# Patient Record
Sex: Female | Born: 1985 | Race: White | Hispanic: No | Marital: Married | Smoking: Never smoker
Health system: Southern US, Community
[De-identification: ages and names within clinical notes are randomized; demographics above are authoritative.]

## PROBLEM LIST (undated history)

## (undated) DIAGNOSIS — K5792 Diverticulitis of intestine, part unspecified, without perforation or abscess without bleeding: Secondary | ICD-10-CM

## (undated) DIAGNOSIS — G43909 Migraine, unspecified, not intractable, without status migrainosus: Secondary | ICD-10-CM

## (undated) DIAGNOSIS — N83209 Unspecified ovarian cyst, unspecified side: Secondary | ICD-10-CM

## (undated) DIAGNOSIS — K589 Irritable bowel syndrome without diarrhea: Secondary | ICD-10-CM

## (undated) HISTORY — PX: OTHER SURGICAL HISTORY: SHX169

## (undated) HISTORY — PX: TONSILLECTOMY: SUR1361

## (undated) HISTORY — PX: CHOLECYSTECTOMY: SHX55

## (undated) HISTORY — PX: EXCISIONAL HEMORRHOIDECTOMY: SHX1541

---
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
## 2008-05-15 ENCOUNTER — Emergency Department: Payer: Self-pay | Admitting: Internal Medicine

## 2008-07-28 ENCOUNTER — Emergency Department: Payer: Self-pay | Admitting: Emergency Medicine

## 2008-09-17 ENCOUNTER — Emergency Department: Payer: Self-pay | Admitting: Emergency Medicine

## 2009-07-24 ENCOUNTER — Inpatient Hospital Stay (HOSPITAL_COMMUNITY): Admission: AD | Admit: 2009-07-24 | Discharge: 2009-07-29 | Payer: Self-pay | Admitting: Obstetrics and Gynecology

## 2009-07-24 ENCOUNTER — Ambulatory Visit: Payer: Self-pay | Admitting: Family Medicine

## 2010-08-20 ENCOUNTER — Other Ambulatory Visit: Payer: Self-pay | Admitting: Adult Health

## 2010-08-20 ENCOUNTER — Other Ambulatory Visit (HOSPITAL_COMMUNITY)
Admission: RE | Admit: 2010-08-20 | Discharge: 2010-08-20 | Disposition: A | Discharge status: Home / Self Care | Payer: PRIVATE HEALTH INSURANCE | Source: Ambulatory Visit | Attending: Obstetrics and Gynecology | Admitting: Obstetrics and Gynecology

## 2010-08-20 DIAGNOSIS — Z01419 Encounter for gynecological examination (general) (routine) without abnormal findings: Secondary | ICD-10-CM | POA: Insufficient documentation

## 2010-08-22 LAB — CBC
HCT: 31.8 % — ABNORMAL LOW (ref 36.0–46.0)
HCT: 31.9 % — ABNORMAL LOW (ref 36.0–46.0)
HCT: 34.3 % — ABNORMAL LOW (ref 36.0–46.0)
Hemoglobin: 10.4 g/dL — ABNORMAL LOW (ref 12.0–15.0)
Hemoglobin: 10.4 g/dL — ABNORMAL LOW (ref 12.0–15.0)
Hemoglobin: 11.2 g/dL — ABNORMAL LOW (ref 12.0–15.0)
MCHC: 32.6 g/dL (ref 30.0–36.0)
MCHC: 32.7 g/dL (ref 30.0–36.0)
MCHC: 32.8 g/dL (ref 30.0–36.0)
MCV: 84.2 fL (ref 78.0–100.0)
MCV: 84.3 fL (ref 78.0–100.0)
MCV: 84.8 fL (ref 78.0–100.0)
Platelets: 274 10*3/uL (ref 150–400)
Platelets: 290 10*3/uL (ref 150–400)
Platelets: 294 10*3/uL (ref 150–400)
RBC: 3.77 MIL/uL — ABNORMAL LOW (ref 3.87–5.11)
RBC: 3.78 MIL/uL — ABNORMAL LOW (ref 3.87–5.11)
RBC: 4.05 MIL/uL (ref 3.87–5.11)
RDW: 14 % (ref 11.5–15.5)
RDW: 14.3 % (ref 11.5–15.5)
RDW: 14.4 % (ref 11.5–15.5)
WBC: 10 10*3/uL (ref 4.0–10.5)
WBC: 18.1 10*3/uL — ABNORMAL HIGH (ref 4.0–10.5)
WBC: 8.1 10*3/uL (ref 4.0–10.5)

## 2010-08-22 LAB — COMPREHENSIVE METABOLIC PANEL
ALT: 14 U/L (ref 0–35)
ALT: 16 U/L (ref 0–35)
AST: 14 U/L (ref 0–37)
AST: 16 U/L (ref 0–37)
Albumin: 2.4 g/dL — ABNORMAL LOW (ref 3.5–5.2)
Albumin: 2.4 g/dL — ABNORMAL LOW (ref 3.5–5.2)
Alkaline Phosphatase: 150 U/L — ABNORMAL HIGH (ref 39–117)
Alkaline Phosphatase: 162 U/L — ABNORMAL HIGH (ref 39–117)
BUN: 2 mg/dL — ABNORMAL LOW (ref 6–23)
BUN: 4 mg/dL — ABNORMAL LOW (ref 6–23)
CO2: 21 mEq/L (ref 19–32)
CO2: 24 mEq/L (ref 19–32)
Calcium: 8 mg/dL — ABNORMAL LOW (ref 8.4–10.5)
Calcium: 8.7 mg/dL (ref 8.4–10.5)
Chloride: 104 mEq/L (ref 96–112)
Chloride: 108 mEq/L (ref 96–112)
Creatinine, Ser: 0.43 mg/dL (ref 0.4–1.2)
Creatinine, Ser: <0.3 mg/dL — ABNORMAL LOW (ref 0.4–1.2)
GFR calc Af Amer: >60 mL/min (ref >60)
GFR calc non Af Amer: >60 mL/min (ref >60)
Glucose, Bld: 67 mg/dL — ABNORMAL LOW (ref 70–99)
Glucose, Bld: 75 mg/dL (ref 70–99)
Potassium: 3.4 mEq/L — ABNORMAL LOW (ref 3.5–5.1)
Potassium: 3.4 mEq/L — ABNORMAL LOW (ref 3.5–5.1)
Sodium: 137 mEq/L (ref 135–145)
Sodium: 137 mEq/L (ref 135–145)
Total Bilirubin: 0.4 mg/dL (ref 0.3–1.2)
Total Bilirubin: 0.4 mg/dL (ref 0.3–1.2)
Total Protein: 5.3 g/dL — ABNORMAL LOW (ref 6.0–8.3)
Total Protein: 5.5 g/dL — ABNORMAL LOW (ref 6.0–8.3)

## 2010-08-22 LAB — PROTIME-INR
INR: 1.13 (ref 0.00–1.49)
Prothrombin Time: 14.4 seconds (ref 11.6–15.2)

## 2010-08-22 LAB — TYPE AND SCREEN
ABO/RH(D): A POS
Antibody Screen: NEGATIVE

## 2010-08-22 LAB — STREP B DNA PROBE: Strep Group B Ag: NEGATIVE

## 2010-08-22 LAB — LACTATE DEHYDROGENASE: LDH: 125 U/L (ref 94–250)

## 2010-08-22 LAB — URIC ACID: Uric Acid, Serum: 3 mg/dL (ref 2.4–7.0)

## 2010-08-22 LAB — ABO/RH: ABO/RH(D): A POS

## 2010-08-22 LAB — MAGNESIUM: Magnesium: 4 mg/dL — ABNORMAL HIGH (ref 1.5–2.5)

## 2010-08-22 LAB — APTT: aPTT: 30 seconds (ref 24–37)

## 2010-08-31 ENCOUNTER — Emergency Department (HOSPITAL_COMMUNITY)
Admission: EM | Admit: 2010-08-31 | Discharge: 2010-08-31 | Disposition: A | Discharge status: Home / Self Care | Payer: PRIVATE HEALTH INSURANCE | Attending: Emergency Medicine | Admitting: Emergency Medicine

## 2010-08-31 ENCOUNTER — Emergency Department (HOSPITAL_COMMUNITY): Payer: PRIVATE HEALTH INSURANCE

## 2010-08-31 DIAGNOSIS — W108XXA Fall (on) (from) other stairs and steps, initial encounter: Secondary | ICD-10-CM | POA: Insufficient documentation

## 2010-08-31 DIAGNOSIS — S59909A Unspecified injury of unspecified elbow, initial encounter: Secondary | ICD-10-CM | POA: Insufficient documentation

## 2010-08-31 DIAGNOSIS — S6990XA Unspecified injury of unspecified wrist, hand and finger(s), initial encounter: Secondary | ICD-10-CM | POA: Insufficient documentation

## 2010-08-31 DIAGNOSIS — M79609 Pain in unspecified limb: Secondary | ICD-10-CM | POA: Insufficient documentation

## 2010-08-31 DIAGNOSIS — M25529 Pain in unspecified elbow: Secondary | ICD-10-CM | POA: Insufficient documentation

## 2010-10-17 NOTE — Op Note (Signed)
Sarah Collins, BLOMGREN             ACCOUNT NO.:  [id number]   MEDICAL RECORD NO.:  [id number]          PATIENT TYPE:  AMB   LOCATION:  DAY                           FACILITY:  [facility]   PHYSICIAN:  Lionel December, M.D.    DATE OF BIRTH:  01-Jan-1986   DATE OF PROCEDURE:  DATE OF DISCHARGE:  08/21/2005                                 OPERATIVE REPORT   PROCEDURE:  Esophageal pH monitoring using a Bravo device (48 hours study).   INDICATION:  [name] is an 25 year old Caucasian female with symptoms of  chronic GERD who is not responding to therapy.  She had  esophagogastroduodenoscopy two days ago.  She had very mild changes of  reflux esophagitis limited to GE junction.  Bravo device was placed for the  study.   Please note that this study is being performed off any acid suppression.   FINDINGS:  Day one analysis:  Number of reflux episodes 31, 12 of which  occurred in upright position and 19 in supine and 22 of these occurred in  postprandial period. Number of reflux episodes greater than 5 minutes 0.  Duration of longest reflux was 4 minutes. Time pH less than 4 was 26  minutes. Fraction time pH less than 4 is 1.9%.   Day two analysis:  Number of reflux episodes is 24, 14 of which occurred in  upright position, 11 in supine and 15 in postprandial.  Number of reflux  episodes greater than 5 minutes was 0. Longest reflux episode was 2 minutes.  Time pH less than 4 was 17 minutes.  Fraction time pH less than 4 is 1.3%.   Two-day analysis:  Study duration is 44 hours and 8 minutes. Number of  reflux episodes 55.  Number of refluxes greater than 5 minutes is 0.  Duration of longest reflux episodes was 4 minutes. Time pH below 4 was 43  minutes.  Fraction time pH below 4 is 1.6%.   SYMPTOM DIARY:  The patient reported eight episodes of heartburn during this  study.  Two of these cannot be evaluated because of  trnsient malfunction of  recording device.  Other six episodes of heartburn  were not associated with  acid in her esophagus.   She reported 13 episodes of chest pain, and there was no acid documented  with any of these episodes.   ASSESSMENT:  Sarah Collins is having acid reflux which is within physiologic  range.   She reported eight episodes of heartburn, six of which can be evaluated, and  none of these were associated with acid reflux.  Similarly, she had 13  episodes of chest pain, and acid was documented with none of these episodes.  Therefore, there is virtually no symptom correlation.   Results were reviewed with Sharalee's mother over the phone on March, 25,  2007.   RECOMMENDATIONS:  Empiric therapy with cimetidine 300 mg q.i.d.  She will  return for OV in one month.  If she remains symptomatic, may consider  tricyclic antidepressant at a low-dose or refer her back to tertiary center  for further evaluation.  Lionel December, M.D.  Electronically Signed     NR/MEDQ  D:  08/30/2005  T:  08/31/2005  Job:  [id number]   cc:   Donzetta Sprung  Fax: [phone]

## 2010-10-17 NOTE — Op Note (Signed)
NAMESHEVY, Sarah Collins             ACCOUNT NO.:  [id number]   MEDICAL RECORD NO.:  [id number]          PATIENT TYPE:  AMB   LOCATION:  DAY                           FACILITY:  [facility]   PHYSICIAN:  Lionel December, M.D.    DATE OF BIRTH:  02-28-1986   DATE OF PROCEDURE:  08/21/2005  DATE OF DISCHARGE:  08/21/2005                                 OPERATIVE REPORT   PROCEDURE:  Esophagogastroduodenoscopy with placement of Bravo device for pH  study.   INDICATIONS:  Sarah Collins is an 25 year old Caucasian female with a history of  chronic GERD as well as upper abdominal pain who has not responded to  therapy.  She has had normal abdominal CT, normal CBC, and a sed rate.  She  also complains of dysphagia.  She had barium pill study which was normal.  She has been intolerant of some of the PPIs and she never had any response.  She is undergoing diagnostic EGD with Bravo placement for pH study to find  out if, indeed, she is refluxing and how severe it is.  Procedure and risks  were reviewed with the patient and informed consent was obtained.   MEDICINES FOR CONSCIOUS SEDATION:  Benzocaine spray for oropharyngeal  topical anesthesia, Demerol 50 mg IV, Versed 8 mg IV in divided dose.   FINDINGS:  Procedure performed in endoscopy suite.  The patient's vital  signs and O2 saturation were monitored during procedure and remained stable.  The patient was placed in the left lateral position.  She could never be  well sedated despite fairly high dose of Versed.  Olympus videoscope was  passed via oropharynx without any difficulty into the laryngopharynx and  into the esophagus.  There was a tiny cyst along the posterior wall of  hypopharynx.  Pictures taken for the record.  This was no more than 3 or 4  mm.   ESOPHAGUS:  Mucosa of the esophagus normal.  GE junction was at 39 cm.  There was some erythema at GE junction.   STOMACH:  It was empty and distended very well with insufflation.  Folds in  the  proximal stomach were normal.  Examination of mucosa reveals no  abnormality at body, antrum, pyloric channel, as well as fundus and cardia.   DUODENUM:  Bulbar mucosa was normal.  Scope was passed into the second part  of duodenum where mucosa and folds were normal.  Endoscope was withdrawn.   Bravo device was calibrated.  It was already loaded onto delivery catheter.  Delivery catheter was advanced, blindly, via oropharynx into esophagus to 33  cm from the incisors.  The device was connected to suction apparatus for 30  seconds.  At this point, the plunger was pushed to secure the Bravo device  to esophageal mucosa.  Plunger was then turned clockwise and delivery  catheter was removed.  Endoscope was passed, again, and device was in good  position.  Pictures were taken for the record; and endoscope was withdrawn.   The patient tolerated the procedure well, although she could never be well  sedated.   FINAL DIAGNOSIS:  1.  Mild changes of reflux esophagitis limited to GE junction, otherwise      normal EGD.  2.  Bravo device placed at the distal esophagus for a 48-hour pH study.   RECOMMENDATIONS:  The patient given instructions regarding symptom diary  etcetera; and she will be a returning to this facility on Sunday afternoon,  and I will be a getting back with her with the results.      Lionel December, M.D.  Electronically Signed     NR/MEDQ  D:  08/21/2005  T:  08/24/2005  Job:  [id number]

## 2010-12-16 ENCOUNTER — Other Ambulatory Visit (HOSPITAL_COMMUNITY)
Admission: RE | Admit: 2010-12-16 | Discharge: 2010-12-16 | Disposition: A | Discharge status: Home / Self Care | Payer: PRIVATE HEALTH INSURANCE | Source: Ambulatory Visit | Attending: Obstetrics and Gynecology | Admitting: Obstetrics and Gynecology

## 2010-12-16 ENCOUNTER — Other Ambulatory Visit: Payer: Self-pay | Admitting: Adult Health

## 2010-12-16 DIAGNOSIS — Z113 Encounter for screening for infections with a predominantly sexual mode of transmission: Secondary | ICD-10-CM | POA: Insufficient documentation

## 2010-12-16 DIAGNOSIS — Z01419 Encounter for gynecological examination (general) (routine) without abnormal findings: Secondary | ICD-10-CM | POA: Insufficient documentation

## 2012-07-20 ENCOUNTER — Emergency Department (HOSPITAL_COMMUNITY): Payer: BC Managed Care – PPO

## 2012-07-20 ENCOUNTER — Emergency Department (HOSPITAL_COMMUNITY)
Admission: EM | Admit: 2012-07-20 | Discharge: 2012-07-20 | Disposition: A | Discharge status: Home / Self Care | Payer: BC Managed Care – PPO | Attending: Emergency Medicine | Admitting: Emergency Medicine

## 2012-07-20 ENCOUNTER — Encounter (HOSPITAL_COMMUNITY): Payer: Self-pay | Admitting: *Deleted

## 2012-07-20 DIAGNOSIS — Z8719 Personal history of other diseases of the digestive system: Secondary | ICD-10-CM | POA: Insufficient documentation

## 2012-07-20 DIAGNOSIS — Z3202 Encounter for pregnancy test, result negative: Secondary | ICD-10-CM | POA: Insufficient documentation

## 2012-07-20 DIAGNOSIS — R109 Unspecified abdominal pain: Secondary | ICD-10-CM | POA: Insufficient documentation

## 2012-07-20 DIAGNOSIS — R112 Nausea with vomiting, unspecified: Secondary | ICD-10-CM | POA: Insufficient documentation

## 2012-07-20 DIAGNOSIS — Z8742 Personal history of other diseases of the female genital tract: Secondary | ICD-10-CM | POA: Insufficient documentation

## 2012-07-20 HISTORY — DX: Diverticulitis of intestine, part unspecified, without perforation or abscess without bleeding: K57.92

## 2012-07-20 HISTORY — DX: Unspecified ovarian cyst, unspecified side: N83.209

## 2012-07-20 LAB — COMPREHENSIVE METABOLIC PANEL
ALT: 29 U/L (ref 0–35)
AST: 19 U/L (ref 0–37)
Albumin: 4 g/dL (ref 3.5–5.2)
Alkaline Phosphatase: 88 U/L (ref 39–117)
BUN: 17 mg/dL (ref 6–23)
CO2: 24 mEq/L (ref 19–32)
Calcium: 9.4 mg/dL (ref 8.4–10.5)
Chloride: 102 mEq/L (ref 96–112)
Creatinine, Ser: 0.72 mg/dL (ref 0.50–1.10)
GFR calc Af Amer: >90 mL/min (ref >90)
GFR calc non Af Amer: >90 mL/min (ref >90)
Glucose, Bld: 94 mg/dL (ref 70–99)
Potassium: 4.1 mEq/L (ref 3.5–5.1)
Sodium: 136 mEq/L (ref 135–145)
Total Bilirubin: 0.5 mg/dL (ref 0.3–1.2)
Total Protein: 7.2 g/dL (ref 6.0–8.3)

## 2012-07-20 LAB — CBC WITH DIFFERENTIAL/PLATELET
Basophils Relative: 0 % (ref 0–1)
Eosinophils Absolute: 0.1 10*3/uL (ref 0.0–0.7)
Lymphs Abs: 2.1 10*3/uL (ref 0.7–4.0)
MCH: 31.5 pg (ref 26.0–34.0)
Neutro Abs: 5.6 10*3/uL (ref 1.7–7.7)
Neutrophils Relative %: 67 % (ref 43–77)
Platelets: 330 10*3/uL (ref 150–400)
RBC: 4.61 MIL/uL (ref 3.87–5.11)

## 2012-07-20 LAB — URINALYSIS, ROUTINE W REFLEX MICROSCOPIC
Bilirubin Urine: NEGATIVE
Glucose, UA: NEGATIVE mg/dL
Hgb urine dipstick: NEGATIVE
Ketones, ur: NEGATIVE mg/dL
Leukocytes, UA: NEGATIVE
Nitrite: NEGATIVE
Protein, ur: NEGATIVE mg/dL
Specific Gravity, Urine: 1.01 (ref 1.005–1.030)
Urobilinogen, UA: 0.2 mg/dL (ref 0.0–1.0)
pH: 7 (ref 5.0–8.0)

## 2012-07-20 MED ORDER — PROMETHAZINE HCL 25 MG/ML IJ SOLN
12.5000 mg | Freq: Once | INTRAMUSCULAR | Status: AC
  Administered 2012-07-20: 12.5 mg via INTRAVENOUS
  Filled 2012-07-20: qty 1

## 2012-07-20 MED ORDER — PROMETHAZINE HCL 25 MG/ML IJ SOLN
12.5000 mg | Freq: Once | INTRAMUSCULAR | Status: DC
  Filled 2012-07-20: qty 1

## 2012-07-20 MED ORDER — SODIUM CHLORIDE 0.9 % IV SOLN
Freq: Once | INTRAVENOUS | Status: AC
  Administered 2012-07-20: 09:00:00 via INTRAVENOUS

## 2012-07-20 MED ORDER — IOHEXOL 300 MG/ML  SOLN
100.0000 mL | Freq: Once | INTRAMUSCULAR | Status: AC | PRN
  Administered 2012-07-20: 100 mL via INTRAVENOUS

## 2012-07-20 MED ORDER — SODIUM CHLORIDE 0.9 % IV SOLN
Freq: Once | INTRAVENOUS | Status: AC
  Administered 2012-07-20: 10:00:00 via INTRAVENOUS

## 2012-07-20 MED ORDER — METOCLOPRAMIDE HCL 5 MG/ML IJ SOLN
10.0000 mg | Freq: Once | INTRAMUSCULAR | Status: AC
  Administered 2012-07-20: 10 mg via INTRAVENOUS
  Filled 2012-07-20: qty 2

## 2012-07-20 MED ORDER — OXYCODONE-ACETAMINOPHEN 5-325 MG PO TABS: 1.0000 | ORAL_TABLET | ORAL | Status: DC | PRN

## 2012-07-20 MED ORDER — HYDROMORPHONE HCL PF 1 MG/ML IJ SOLN
1.0000 mg | Freq: Once | INTRAMUSCULAR | Status: AC
  Administered 2012-07-20: 1 mg via INTRAVENOUS
  Filled 2012-07-20: qty 1

## 2012-07-20 MED ORDER — PROMETHAZINE HCL 25 MG PO TABS: 25.0000 mg | ORAL_TABLET | Freq: Four times a day (QID) | ORAL | Status: DC | PRN

## 2012-07-20 MED ORDER — IOHEXOL 300 MG/ML  SOLN
50.0000 mL | Freq: Once | INTRAMUSCULAR | Status: AC | PRN
  Administered 2012-07-20: 50 mL via ORAL

## 2012-07-20 NOTE — ED Notes (Signed)
Phenergan 12.5 mg given.

## 2012-07-20 NOTE — ED Notes (Signed)
Lower abdominal pain began last night, worse to LLQ. Pt states hx of both diverticulitis and cyst on ovary. States she has been sore since August when diagnosed but pain became much worse last night. Nausea.

## 2012-07-20 NOTE — ED Provider Notes (Signed)
History     CSN: [id number]  Arrival date & time 07/20/12  0750   First MD Initiated Contact with Patient 07/20/12 [phone]      Chief Complaint  Patient presents with  . Abdominal Pain  . Nausea    (Consider location/radiation/quality/duration/timing/severity/associated sxs/prior treatment) HPI Comments: Patient c/o gradual onset of diffuse left abdominal pain that began yesterday. Pain worse to the left upper abdomen. She states the pain diminished somewhat last evening , but returned this morning when she woke up.   Describes the pain as sharp and radiating to just below her umbilicus.  States she was diagnosed with diverticulitis and ovarian cyst in August of last year and pain feels similar. Was seen by her PMD at [facility] in [place] and had a CT scan of her abd at [facility].   She also reports nausea since yesterday.  She denies vaginal bleeding or discharge, dysuria, flank pain, vomiting, diarrhea, menses, or recent abdominal surgeries. She does not take/use any methods of contraception  Patient is a 27 y.o. female presenting with abdominal pain. The history is provided by the patient.  Abdominal Pain Pain location:  LUQ and LLQ Pain quality: cramping and sharp   Pain radiates to:  Periumbilical region Pain severity:  Moderate Onset quality:  Gradual Duration:  24 hours Timing:  Constant Progression:  Worsening Chronicity:  New Context: not alcohol use, not diet changes, not eating, not medication withdrawal, not previous surgeries and not suspicious food intake   Relieved by:  Nothing Worsened by:  Nothing tried Ineffective treatments:  Heat and eating Associated symptoms: nausea and vomiting   Associated symptoms: no chest pain, no chills, no constipation, no diarrhea, no dysuria, no fever, no flatus, no hematemesis, no hematuria, no melena, no shortness of breath, no sore throat, no vaginal bleeding and no vaginal discharge   Risk factors: not pregnant and no recent  hospitalization     Past Medical History  Diagnosis Date  . Diverticulitis   . Ovarian cyst     left ovary    Past Surgical History  Procedure Laterality Date  . Cholecystectomy    . Tonsillectomy      No family history on file.  History  Substance Use Topics  . Smoking status: Never Smoker   . Smokeless tobacco: Not on file  . Alcohol Use: No    OB History   Grav Para Term Preterm Abortions TAB SAB Ect Mult Living                  Review of Systems  Constitutional: Positive for appetite change. Negative for fever, chills and activity change.  HENT: Negative for sore throat.   Respiratory: Negative for chest tightness and shortness of breath.   Cardiovascular: Negative for chest pain.  Gastrointestinal: Positive for nausea, vomiting and abdominal pain. Negative for diarrhea, constipation, blood in stool, melena, abdominal distention, flatus and hematemesis.  Genitourinary: Negative for dysuria, hematuria, flank pain, decreased urine volume, vaginal bleeding, vaginal discharge and difficulty urinating.  Musculoskeletal: Negative for back pain.  Skin: Negative for color change and rash.  Neurological: Negative for dizziness, weakness and numbness.  Hematological: Negative for adenopathy.  All other systems reviewed and are negative.    Allergies  Codeine; Morphine and related; and Zofran  Home Medications  No current outpatient prescriptions on file.  BP 132/77  Pulse 78  Temp(Src) 98.7 F (37.1 C) (Oral)  Resp 16  Ht 5\' 5"  (1.651 m)  Wt 192 lb (  87.091 kg)  BMI 31.95 kg/m2  SpO2 98%  LMP 07/06/2012  Physical Exam  Nursing note and vitals reviewed. Constitutional: She is oriented to person, place, and time. She appears well-developed and well-nourished. No distress.  HENT:  Head: Normocephalic and atraumatic.  Mouth/Throat: Oropharynx is clear and moist.  Cardiovascular: Normal rate, regular rhythm, normal heart sounds and intact distal pulses.   No  murmur heard. Pulmonary/Chest: Effort normal and breath sounds normal.  Abdominal: Soft. Normal appearance and bowel sounds are normal. She exhibits no distension and no mass. There is no hepatosplenomegaly. There is tenderness in the left upper quadrant and left lower quadrant. There is no rigidity, no rebound, no guarding, no CVA tenderness and no tenderness at McBurney's point.    Musculoskeletal: She exhibits no edema.  Neurological: She is alert and oriented to person, place, and time. She exhibits normal muscle tone. Coordination normal.  Skin: Skin is warm and dry.    ED Course  Procedures (including critical care time)  Results for orders placed during the hospital encounter of 07/20/12  CBC WITH DIFFERENTIAL      Result Value Range   WBC 8.2  4.0 - 10.5 K/uL   RBC 4.61  3.87 - 5.11 MIL/uL   Hemoglobin 14.5  12.0 - 15.0 g/dL   HCT 47.8  29.5 - 62.1 %   MCV 90.9  78.0 - 100.0 fL   MCH 31.5  26.0 - 34.0 pg   MCHC 34.6  30.0 - 36.0 g/dL   RDW 30.8  65.7 - 84.6 %   Platelets 330  150 - 400 K/uL   Neutrophils Relative 67  43 - 77 %   Neutro Abs 5.6  1.7 - 7.7 K/uL   Lymphocytes Relative 25  12 - 46 %   Lymphs Abs 2.1  0.7 - 4.0 K/uL   Monocytes Relative 6  3 - 12 %   Monocytes Absolute 0.5  0.1 - 1.0 K/uL   Eosinophils Relative 1  0 - 5 %   Eosinophils Absolute 0.1  0.0 - 0.7 K/uL   Basophils Relative 0  0 - 1 %   Basophils Absolute 0.0  0.0 - 0.1 K/uL  COMPREHENSIVE METABOLIC PANEL      Result Value Range   Sodium 136  135 - 145 mEq/L   Potassium 4.1  3.5 - 5.1 mEq/L   Chloride 102  96 - 112 mEq/L   CO2 24  19 - 32 mEq/L   Glucose, Bld 94  70 - 99 mg/dL   BUN 17  6 - 23 mg/dL   Creatinine, Ser 9.62  0.50 - 1.10 mg/dL   Calcium 9.4  8.4 - 95.2 mg/dL   Total Protein 7.2  6.0 - 8.3 g/dL   Albumin 4.0  3.5 - 5.2 g/dL   AST 19  0 - 37 U/L   ALT 29  0 - 35 U/L   Alkaline Phosphatase 88  39 - 117 U/L   Total Bilirubin 0.5  0.3 - 1.2 mg/dL   GFR calc non Af Amer >90   >90 mL/min   GFR calc Af Amer >90  >90 mL/min  URINALYSIS, ROUTINE W REFLEX MICROSCOPIC      Result Value Range   Color, Urine YELLOW  YELLOW   APPearance CLEAR  CLEAR   Specific Gravity, Urine 1.010  1.005 - 1.030   pH 7.0  5.0 - 8.0   Glucose, UA NEGATIVE  NEGATIVE mg/dL   Hgb urine dipstick NEGATIVE  NEGATIVE   Bilirubin Urine NEGATIVE  NEGATIVE   Ketones, ur NEGATIVE  NEGATIVE mg/dL   Protein, ur NEGATIVE  NEGATIVE mg/dL   Urobilinogen, UA 0.2  0.0 - 1.0 mg/dL   Nitrite NEGATIVE  NEGATIVE   Leukocytes, UA NEGATIVE  NEGATIVE  PREGNANCY, URINE      Result Value Range   Preg Test, Ur NEGATIVE  NEGATIVE      Ct Abdomen Pelvis W Contrast  07/20/2012  *RADIOLOGY REPORT*  Clinical Data: Abdominal pain, nausea.  CT ABDOMEN AND PELVIS WITH CONTRAST  Technique:  Multidetector CT imaging of the abdomen and pelvis was performed following the standard protocol during bolus administration of intravenous contrast.  Contrast: OMNIPAQUE IOHEXOL 300 MG/ML  SOLN  Comparison: 12/30/2011  Findings: Lung bases are clear.  No effusions.  Heart is normal size.  Liver is unremarkable except for a an area of focal fatty infiltration adjacent to the falciform ligament.  Prior cholecystectomy.  Stomach, spleen, pancreas, adrenals and left kidney are normal.  Small cyst in the mid pole of the right kidney. No hydronephrosis.  Appendix is visualized and is normal.  Previously seen inflammation anterior to the sigmoid colon has resolved.  No current inflammatory process in the abdomen or pelvis.  Large and small bowel are grossly unremarkable.  Uterus, ovaries and urinary bladder are grossly unremarkable. Small amount of free fluid in the pelvis, likely physiologic.  Previously seen left ovarian cyst has resolved.  No acute bony abnormality.  IMPRESSION: No acute findings in the abdomen or pelvis.   Original Report Authenticated By: Charlett Nose, M.D.      MDM    Patient has received IVF's, dilaudid, IV  reglan and phenergan.  Pain has improved, vitals remain stable.  I have discussed imaging and lab results with the patient and her mother.  Patient has reported hx of diverticulitis and left ovarian cyst that have both resolved according to today's scan.  Pain today has been mostly LUQ.  Patient's mother states that she is former employee of [employer],. [employer] and agrees to arrange for close f/u with him in 2-3 days. She also agrees to return here if the sx's worsen.    I have also discussed patient's hx and results with the EDP prior to d/c.  The patient appears reasonably screened and/or stabilized for discharge and I doubt any other medical condition or other [facility] requiring further screening, evaluation, or treatment in the ED at this time prior to discharge.     Charlane Westry L. [place], [place] 07/20/12 2122

## 2012-07-20 NOTE — ED Notes (Signed)
Pt reports h/o diverticulitis. Left lower ab area has been sore for several weeks, pain became severe last night. +nausea, no vomiting or diarrhea. No feve.r

## 2012-07-20 NOTE — ED Notes (Signed)
Patient called out feeling nauseated RN made aware.

## 2012-07-20 NOTE — ED Notes (Signed)
Pt resting in bed, voices no complaints

## 2012-07-22 NOTE — ED Provider Notes (Signed)
Medical screening examination/treatment/procedure(s) were performed by non-physician practitioner and as supervising physician I was immediately available for consultation/collaboration.  Shelda Jakes, MD 07/22/12 [phone]

## 2012-08-11 ENCOUNTER — Encounter (HOSPITAL_COMMUNITY): Payer: Self-pay | Admitting: *Deleted

## 2012-08-11 ENCOUNTER — Emergency Department (HOSPITAL_COMMUNITY): Payer: BC Managed Care – PPO

## 2012-08-11 ENCOUNTER — Emergency Department (HOSPITAL_COMMUNITY)
Admission: EM | Admit: 2012-08-11 | Discharge: 2012-08-11 | Disposition: A | Discharge status: Home / Self Care | Payer: BC Managed Care – PPO | Attending: Emergency Medicine | Admitting: Emergency Medicine

## 2012-08-11 DIAGNOSIS — R1084 Generalized abdominal pain: Secondary | ICD-10-CM | POA: Insufficient documentation

## 2012-08-11 DIAGNOSIS — Z79899 Other long term (current) drug therapy: Secondary | ICD-10-CM | POA: Insufficient documentation

## 2012-08-11 DIAGNOSIS — R197 Diarrhea, unspecified: Secondary | ICD-10-CM | POA: Insufficient documentation

## 2012-08-11 DIAGNOSIS — Z8742 Personal history of other diseases of the female genital tract: Secondary | ICD-10-CM | POA: Insufficient documentation

## 2012-08-11 DIAGNOSIS — Z8719 Personal history of other diseases of the digestive system: Secondary | ICD-10-CM | POA: Insufficient documentation

## 2012-08-11 DIAGNOSIS — Z8679 Personal history of other diseases of the circulatory system: Secondary | ICD-10-CM | POA: Insufficient documentation

## 2012-08-11 DIAGNOSIS — R109 Unspecified abdominal pain: Secondary | ICD-10-CM

## 2012-08-11 HISTORY — DX: Irritable bowel syndrome, unspecified: K58.9

## 2012-08-11 HISTORY — DX: Migraine, unspecified, not intractable, without status migrainosus: G43.909

## 2012-08-11 LAB — URINE MICROSCOPIC-ADD ON

## 2012-08-11 LAB — URINALYSIS, ROUTINE W REFLEX MICROSCOPIC
Glucose, UA: NEGATIVE mg/dL
Specific Gravity, Urine: <1.005 — ABNORMAL LOW (ref 1.005–1.030)
pH: 6 (ref 5.0–8.0)

## 2012-08-11 LAB — LIPASE, BLOOD: Lipase: 38 U/L (ref 11–59)

## 2012-08-11 LAB — CBC WITH DIFFERENTIAL/PLATELET
Basophils Relative: 1 % (ref 0–1)
Hemoglobin: 15 g/dL (ref 12.0–15.0)
Lymphs Abs: 2.4 10*3/uL (ref 0.7–4.0)
Monocytes Relative: 6 % (ref 3–12)
Neutro Abs: 5.2 10*3/uL (ref 1.7–7.7)
Neutrophils Relative %: 63 % (ref 43–77)
Platelets: 344 10*3/uL (ref 150–400)
RBC: 4.74 MIL/uL (ref 3.87–5.11)

## 2012-08-11 LAB — COMPREHENSIVE METABOLIC PANEL
ALT: 30 U/L (ref 0–35)
Albumin: 4.1 g/dL (ref 3.5–5.2)
Alkaline Phosphatase: 100 U/L (ref 39–117)
BUN: 19 mg/dL (ref 6–23)
Chloride: 101 mEq/L (ref 96–112)
Potassium: 4.4 mEq/L (ref 3.5–5.1)
Sodium: 137 mEq/L (ref 135–145)
Total Bilirubin: 0.5 mg/dL (ref 0.3–1.2)

## 2012-08-11 LAB — PREGNANCY, URINE: Preg Test, Ur: NEGATIVE

## 2012-08-11 MED ORDER — PROMETHAZINE HCL 25 MG/ML IJ SOLN
12.5000 mg | Freq: Once | INTRAMUSCULAR | Status: AC
  Administered 2012-08-11: 12.5 mg via INTRAVENOUS
  Filled 2012-08-11: qty 1

## 2012-08-11 MED ORDER — PROMETHAZINE HCL 25 MG PO TABS: 25.0000 mg | ORAL_TABLET | Freq: Four times a day (QID) | ORAL | Status: DC | PRN

## 2012-08-11 MED ORDER — FAMOTIDINE IN NACL 20-0.9 MG/50ML-% IV SOLN
20.0000 mg | Freq: Once | INTRAVENOUS | Status: AC
  Administered 2012-08-11: 20 mg via INTRAVENOUS
  Filled 2012-08-11: qty 50

## 2012-08-11 MED ORDER — FENTANYL CITRATE 0.05 MG/ML IJ SOLN
50.0000 ug | INTRAMUSCULAR | Status: AC | PRN
  Administered 2012-08-11 (×2): 50 ug via INTRAVENOUS
  Filled 2012-08-11 (×2): qty 2

## 2012-08-11 MED ORDER — SODIUM CHLORIDE 0.9 % IV SOLN
INTRAVENOUS | Status: DC
  Administered 2012-08-11: 08:00:00 via INTRAVENOUS

## 2012-08-11 MED ORDER — HYDROCODONE-ACETAMINOPHEN 5-325 MG PO TABS: ORAL_TABLET | ORAL | Status: DC

## 2012-08-11 MED ORDER — IOHEXOL 300 MG/ML  SOLN
100.0000 mL | Freq: Once | INTRAMUSCULAR | Status: AC | PRN
  Administered 2012-08-11: 100 mL via INTRAVENOUS

## 2012-08-11 MED ORDER — IOHEXOL 300 MG/ML  SOLN
50.0000 mL | Freq: Once | INTRAMUSCULAR | Status: AC | PRN
  Administered 2012-08-11: 50 mL via ORAL

## 2012-08-11 NOTE — ED Notes (Signed)
Patient has had lower abdominal pain x 1 month.  Seen here at that time and referred to PMD.  No dx made.  Continues to have pain w/periods of severe escalation.  Eating and movement make it worse.  Relieved by nothing.  Menstrual cycles have been normal.  Frequent bouts of diarrhea, no hematachezia.

## 2012-08-11 NOTE — ED Provider Notes (Signed)
History     CSN: [id number]  Arrival date & time 08/11/12  0747   First MD Initiated Contact with Patient 08/11/12 [phone]      Chief Complaint  Patient presents with  . Abdominal Pain     HPI Pt was seen at 0800.   Per pt, c/o gradual onset and persistence of waxing and waning generalized abd "pain" for the past 1 month, has become constant over the past several days. States the pain starts in her LUQ, then radiates throughout her entire abd area.  Has been associated with several episodes of diarrhea. Pain worsens when she eats and moves her torso. Pt has been eval by her PMD and in the ED's at [facility] and [facility] over the past 1 month for same without definitive diagnosis.  Pt has f/u with her PMD after her ED visits, and is due to see a GI MD in the next 2 weeks.  Pt endorses hx of diverticulitis, as well as IBS.  Has not been taking her IBS medications.  Denies vomiting, no fevers, no back pain, no rash, no CP/SOB, no black or blood in stools, no dysuria/hematuria, no vaginal bleeding/discharge.       Past Medical History  Diagnosis Date  . Diverticulitis   . Ovarian cyst     left ovary  . IBS (irritable bowel syndrome)   . Migraine headache     Past Surgical History  Procedure Laterality Date  . Cholecystectomy    . Tonsillectomy    . Vaginal hematoma repair      History  Substance Use Topics  . Smoking status: Never Smoker   . Smokeless tobacco: Not on file  . Alcohol Use: No    OB History   Grav Para Term Preterm Abortions TAB SAB Ect Mult Living   1 1 1              Review of Systems ROS: Statement: All systems negative except as marked or noted in the HPI; Constitutional: Negative for fever and chills. ; ; Eyes: Negative for eye pain, redness and discharge. ; ; ENMT: Negative for ear pain, hoarseness, nasal congestion, sinus pressure and sore throat. ; ; Cardiovascular: Negative for chest pain, palpitations, diaphoresis, dyspnea and peripheral edema. ; ;  Respiratory: Negative for cough, wheezing and stridor. ; ; Gastrointestinal: +abd pain, diarrhea. Negative for nausea, vomiting, blood in stool, hematemesis, jaundice and rectal bleeding. . ; ; Genitourinary: Negative for dysuria, flank pain and hematuria. ; ; Musculoskeletal: Negative for back pain and neck pain. Negative for swelling and trauma.; ; Skin: Negative for pruritus, rash, abrasions, blisters, bruising and skin lesion.; ; Neuro: Negative for headache, lightheadedness and neck stiffness. Negative for weakness, altered level of consciousness , altered mental status, extremity weakness, paresthesias, involuntary movement, seizure and syncope.       Allergies  Codeine; Morphine and related; and Zofran  Home Medications   Current Outpatient Rx  Name  Route  Sig  Dispense  Refill  . citalopram (CELEXA) 40 MG tablet   Oral   Take 40 mg by mouth at bedtime.         Marland Kitchen ibuprofen (ADVIL,MOTRIN) 200 MG tablet   Oral   Take 400 mg by mouth every 8 (eight) hours as needed for pain.         Marland Kitchen oxyCODONE-acetaminophen (PERCOCET/ROXICET) 5-325 MG per tablet   Oral   Take 1 tablet by mouth every 4 (four) hours as needed for pain.  15 tablet   0   . promethazine (PHENERGAN) 25 MG tablet   Oral   Take 1 tablet (25 mg total) by mouth every 6 (six) hours as needed for nausea.   12 tablet   0     BP 132/91  Pulse 74  Temp(Src) 98.1 F (36.7 C) (Oral)  Resp 18  Ht 5\' 5"  (1.651 m)  Wt 195 lb (88.451 kg)  BMI 32.45 kg/m2  SpO2 96%  LMP 08/09/2012  Physical Exam 0805: Physical examination:  Nursing notes reviewed; Vital signs and O2 SAT reviewed;  Constitutional: Well developed, Well nourished, Well hydrated, In no acute distress; Head:  Normocephalic, atraumatic; Eyes: EOMI, PERRL, No scleral icterus; ENMT: Mouth and pharynx normal, Mucous membranes moist; Neck: Supple, Full range of motion, No lymphadenopathy; Cardiovascular: Regular rate and rhythm, No murmur, rub, or gallop;  Respiratory: Breath sounds clear & equal bilaterally, No rales, rhonchi, wheezes.  Speaking full sentences with ease, Normal respiratory effort/excursion; Chest: Nontender, Movement normal; Abdomen: Soft, +diffuse tenderness to palp. No rebound or guarding. Nondistended, Normal bowel sounds;; Extremities: Pulses normal, No tenderness, No edema, No calf edema or asymmetry.; Neuro: AA&Ox3, Major CN grossly intact.  Speech clear. No gross focal motor or sensory deficits in extremities.; Skin: Color normal, Warm, Dry.   ED Course  Procedures    MDM  MDM Reviewed: previous chart, nursing note and vitals Reviewed previous: labs and CT scan Interpretation: labs and CT scan   Results for orders placed during the hospital encounter of 08/11/12  URINALYSIS, ROUTINE W REFLEX MICROSCOPIC      Result Value Range   Color, Urine YELLOW  YELLOW   APPearance CLEAR  CLEAR   Specific Gravity, Urine <1.005 (*) 1.005 - 1.030   pH 6.0  5.0 - 8.0   Glucose, UA NEGATIVE  NEGATIVE mg/dL   Hgb urine dipstick LARGE (*) NEGATIVE   Bilirubin Urine NEGATIVE  NEGATIVE   Ketones, ur NEGATIVE  NEGATIVE mg/dL   Protein, ur NEGATIVE  NEGATIVE mg/dL   Urobilinogen, UA 0.2  0.0 - 1.0 mg/dL   Nitrite NEGATIVE  NEGATIVE   Leukocytes, UA TRACE (*) NEGATIVE  PREGNANCY, URINE      Result Value Range   Preg Test, Ur NEGATIVE  NEGATIVE  CBC WITH DIFFERENTIAL      Result Value Range   WBC 8.3  4.0 - 10.5 K/uL   RBC 4.74  3.87 - 5.11 MIL/uL   Hemoglobin 15.0  12.0 - 15.0 g/dL   HCT 16.1  09.6 - 04.5 %   MCV 91.4  78.0 - 100.0 fL   MCH 31.6  26.0 - 34.0 pg   MCHC 34.6  30.0 - 36.0 g/dL   RDW 40.9  81.1 - 91.4 %   Platelets 344  150 - 400 K/uL   Neutrophils Relative 63  43 - 77 %   Neutro Abs 5.2  1.7 - 7.7 K/uL   Lymphocytes Relative 29  12 - 46 %   Lymphs Abs 2.4  0.7 - 4.0 K/uL   Monocytes Relative 6  3 - 12 %   Monocytes Absolute 0.5  0.1 - 1.0 K/uL   Eosinophils Relative 2  0 - 5 %   Eosinophils Absolute 0.1   0.0 - 0.7 K/uL   Basophils Relative 1  0 - 1 %   Basophils Absolute 0.0  0.0 - 0.1 K/uL  COMPREHENSIVE METABOLIC PANEL      Result Value Range   Sodium 137  135 -  145 mEq/L   Potassium 4.4  3.5 - 5.1 mEq/L   Chloride 101  96 - 112 mEq/L   CO2 24  19 - 32 mEq/L   Glucose, Bld 100 (*) 70 - 99 mg/dL   BUN 19  6 - 23 mg/dL   Creatinine, Ser 1.61  0.50 - 1.10 mg/dL   Calcium 9.4  8.4 - 09.6 mg/dL   Total Protein 7.7  6.0 - 8.3 g/dL   Albumin 4.1  3.5 - 5.2 g/dL   AST 16  0 - 37 U/L   ALT 30  0 - 35 U/L   Alkaline Phosphatase 100  39 - 117 U/L   Total Bilirubin 0.5  0.3 - 1.2 mg/dL   GFR calc non Af Amer >90  >90 mL/min   GFR calc Af Amer >90  >90 mL/min  LIPASE, BLOOD      Result Value Range   Lipase 38  11 - 59 U/L  URINE MICROSCOPIC-ADD ON      Result Value Range   Squamous Epithelial / LPF MANY (*) RARE   WBC, UA 0-2  <3 WBC/hpf   RBC / HPF 7-10  <3 RBC/hpf   Bacteria, UA MANY (*) RARE   Ct Abdomen Pelvis W Contrast 08/11/2012  *RADIOLOGY REPORT*  Clinical Data: Abdominal pain.  CT ABDOMEN AND PELVIS WITH CONTRAST  Technique:  Multidetector CT imaging of the abdomen and pelvis was performed following the standard protocol during bolus administration of intravenous contrast.  Contrast: OMNIPAQUE IOHEXOL 300 MG/ML  SOLN  Comparison: 07/20/2012  Findings: Lung bases are clear.  No effusions.  Heart is normal size.  Prior cholecystectomy.  Liver, spleen, pancreas, adrenals and left kidney unremarkable.  Small cyst in the mid pole of the right kidney, stable.  No hydronephrosis.  Small cyst or follicle in the left ovary.  Right ovary, uterus and urinary bladder unremarkable.  Moderate stool in the right colon, otherwise unremarkable.  Small bowel is decompressed.  No free fluid, free air or adenopathy.  No acute bony abnormality.  IMPRESSION: No acute findings in the abdomen or pelvis.   Original Report Authenticated By: Charlett Nose, M.D.     1115:  Udip contaminated. No acute  findings on workup today.  Encouraged to f/u with GI MD.  Pt has tol PO well while in the ED without N/V.  No stooling while in the ED.  Abd benign, VSS. Wants to go home now.  Dx and testing d/w pt.  Questions answered.  Verb understanding, agreeable to d/c home with outpt f/u.               Laray Anger, DO 08/13/12 1454

## 2012-08-17 ENCOUNTER — Ambulatory Visit (INDEPENDENT_AMBULATORY_CARE_PROVIDER_SITE_OTHER): Payer: BC Managed Care – PPO | Admitting: Gastroenterology

## 2012-08-17 ENCOUNTER — Encounter: Payer: Self-pay | Admitting: Gastroenterology

## 2012-08-17 VITALS — BP 139/84 | HR 89 | Temp 97.9°F | Ht 65.0 in | Wt 199.9 lb

## 2012-08-17 DIAGNOSIS — R197 Diarrhea, unspecified: Secondary | ICD-10-CM

## 2012-08-17 DIAGNOSIS — R110 Nausea: Secondary | ICD-10-CM

## 2012-08-17 DIAGNOSIS — R109 Unspecified abdominal pain: Secondary | ICD-10-CM

## 2012-08-17 DIAGNOSIS — K219 Gastro-esophageal reflux disease without esophagitis: Secondary | ICD-10-CM

## 2012-08-17 MED ORDER — DICYCLOMINE HCL 10 MG PO CAPS: 10.0000 mg | ORAL_CAPSULE | Freq: Three times a day (TID) | ORAL | Status: DC

## 2012-08-17 MED ORDER — PANTOPRAZOLE SODIUM 20 MG PO TBEC: 20.0000 mg | DELAYED_RELEASE_TABLET | Freq: Every day | ORAL | Status: DC

## 2012-08-17 NOTE — Progress Notes (Signed)
Primary Care Physician:  Donzetta Sprung, MD Primary Gastroenterologist:  Dr. Jena Gauss   Chief Complaint  Patient presents with  . Abdominal Pain    x 1 month  . Nausea  . Emesis    HPI:   Sarah Collins is a pleasant 27 year old female who presents today at the request of [facility] secondary to abdominal pain, diarrhea. 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 She notes symptom onset last summer, with presentation to [facility] ED with abdominal pain. CT abd/pelvis retrieved July 2013 with possible epiploic appendagitis, unable to exclude colitis/diverticulitis due to the inflammation extending to anterior wall of sigmoid colon, a rounded cystic structure associated with left adnexa, and probable right renal cysts. She states she was told she had diverticulitis. Treated with abx without much improvement.  She has been to [facility] ED in February and March 2014. CT with contrast was done both times negative. LFTs, CBC, lipase all normal.  Pain is reported as left side upper and lower quadrants, lower abdomen, feels bloated and nauseated. Symptoms worsening X 1 month. +diarrhea. Was having 8-10 loose stools per day but now has been a few days since has gone. Pre-morbid was once a day. No rectal bleeding. Was on Cipro last week for bladder infection. No fever/chills. Pain is constant. Eases off at night when she can lay still. Moving worsens. No pain with eating, just nausea. +reflux, no PPI. No melena. Stools are greenish. Was taking Goodys powders daily 2 a day for migraines and Ibuprofen, recently. +abdominal bloating.  Has hx of IBS. Diagnosed at [facility]. Was seen there for IBS issues. Past hx of loose stools.     Past Medical History  Diagnosis Date  . Diverticulitis   . Ovarian cyst     left ovary  . IBS (irritable bowel syndrome)   . Migraine headache     Past Surgical History  Procedure Laterality Date  . Cholecystectomy      age 55  . Tonsillectomy    . Vaginal hematoma  repair      Current Outpatient Prescriptions  Medication Sig Dispense Refill  . citalopram (CELEXA) 40 MG tablet Take 40 mg by mouth at bedtime.      Marland Kitchen HYDROcodone-acetaminophen (NORCO/VICODIN) 5-325 MG per tablet 1 or 2 tabs PO q6 hours prn pain  20 tablet  0  . promethazine (PHENERGAN) 25 MG tablet Take 1 tablet (25 mg total) by mouth every 6 (six) hours as needed for nausea.  8 tablet  0  . propranolol ER (INDERAL LA) 60 MG 24 hr capsule Take 60 mg by mouth daily.      . ciprofloxacin (CIPRO) 500 MG tablet Take 500 mg by mouth 2 (two) times daily. For 5 days (picked up on 08/10/12) have not started      . dicyclomine (BENTYL) 10 MG capsule Take 1 capsule (10 mg total) by mouth 4 (four) times daily -  before meals and at bedtime.  120 capsule  3  . pantoprazole (PROTONIX) 20 MG tablet Take 1 tablet (20 mg total) by mouth daily.  30 tablet  3   No current facility-administered medications for this visit.    Allergies as of 08/17/2012 - Review Complete 08/17/2012  Allergen Reaction Noted  . Codeine Hives 07/20/2012  . Morphine and related Hives 07/20/2012  . Zofran (ondansetron) Hives 07/20/2012    Family History  Problem Relation Age of Onset  . Colon cancer Neg Hx     History  Social History  . Marital Status: Single    Spouse Name: N/A    Number of Children: N/A  . Years of Education: N/A   Occupational History  . [employer]     RN   Social History Main Topics  . Smoking status: Never Smoker   . Smokeless tobacco: Not on file  . Alcohol Use: No  . Drug Use: Not on file  . Sexually Active: Not on file   Other Topics Concern  . Not on file   Social History Narrative  . No narrative on file    Review of Systems: Gen: Denies any fever, chills, fatigue, weight loss, +lack of appetite CV: Denies chest pain, heart palpitations, peripheral edema, syncope.  Resp: Denies shortness of breath at rest or with exertion. Denies wheezing or cough.  GI: SEE HPI GU  : Denies urinary burning, urinary frequency, urinary hesitancy MS: Denies joint pain, muscle weakness, cramps, or limitation of movement.  Derm: Denies rash, itching, dry skin Psych: Denies depression, anxiety, memory loss, and confusion Heme: Denies bruising, bleeding, and enlarged lymph nodes.  Physical Exam: BP 139/84  Pulse 89  Temp(Src) 97.9 F (36.6 C) (Oral)  Ht 5\' 5"  (1.651 m)  Wt 199 lb 14.4 oz (90.674 kg)  BMI 33.27 kg/m2  LMP 08/09/2012 General:   Alert and oriented. Pleasant and cooperative. Well-nourished and well-developed.  Head:  Normocephalic and atraumatic. Eyes:  Without icterus, sclera clear and conjunctiva pink.  Ears:  Normal auditory acuity. Nose:  No deformity, discharge,  or lesions. Mouth:  No deformity or lesions, oral mucosa pink.  Neck:  Supple, without mass or thyromegaly. Lungs:  Clear to auscultation bilaterally. No wheezes, rales, or rhonchi. No distress.  Heart:  S1, S2 present without murmurs appreciated.  Abdomen:  +BS, soft, TTP LLQ, lower abdomen, and non-distended. No HSM noted. No guarding or rebound. No masses appreciated.  Rectal:  Deferred  Msk:  Symmetrical without gross deformities. Normal posture. Extremities:  Without clubbing or edema. Neurologic:  Alert and  oriented x4;  grossly normal neurologically. Skin:  Intact without significant lesions or rashes. Cervical Nodes:  No significant cervical adenopathy. Psych:  Alert and cooperative. Normal mood and affect.

## 2012-08-17 NOTE — Patient Instructions (Addendum)
Please complete stool sample and return to our office as soon as possible.  Complete blood work. We will call you with the results. I will also call you with a decision regarding a colonoscopy in the near future.  Start taking Protonix once a day, 30 minutes before breakfast. Continue to avoid Goody powders.  Start taking Bentyl with meals and at bedtime, no more than 4 per day. This can cause constipation, dry mouth.   Further recommendations in very near future.

## 2012-08-18 LAB — TISSUE TRANSGLUTAMINASE, IGA: Tissue Transglutaminase Ab, IgA: 3.9 U/mL (ref <20)

## 2012-08-18 LAB — IGA: IgA: 273 mg/dL (ref 69–380)

## 2012-08-18 LAB — TSH: TSH: 1.015 u[IU]/mL (ref 0.350–4.500)

## 2012-08-19 DIAGNOSIS — R109 Unspecified abdominal pain: Secondary | ICD-10-CM | POA: Insufficient documentation

## 2012-08-19 DIAGNOSIS — R110 Nausea: Secondary | ICD-10-CM | POA: Insufficient documentation

## 2012-08-19 DIAGNOSIS — K219 Gastro-esophageal reflux disease without esophagitis: Secondary | ICD-10-CM | POA: Insufficient documentation

## 2012-08-19 DIAGNOSIS — R197 Diarrhea, unspecified: Secondary | ICD-10-CM | POA: Insufficient documentation

## 2012-08-19 NOTE — Assessment & Plan Note (Signed)
Stool studies, celiac serologies, likely TCS in near future. Bentyl QID.

## 2012-08-19 NOTE — Assessment & Plan Note (Signed)
27 year old female with worsening left-sided and lower abdominal pain since July and possible colitis/diverticulitis on CT scan from [facility] July 2013. 2 subsequent CT scans in February and March this year benign for acute etiology. Notes associated diarrhea, 8-10 loose stools per day, non-bloody. Intermittent nausea. Pain constant, only eases with laying down. Bloating noted. She does described a history of IBS, with a baseline prior of BM once per day.   Need to proceed with GI pathogen stool studies to rule out infectious process.  Check celiac serologies, TSH.  Add Bentyl QID After stool studies reviewed, likely proceed with TCS due to question of colitis on CT. Need to rule out underlying IBD.

## 2012-08-19 NOTE — Assessment & Plan Note (Signed)
Unclear etiology; however, pt has been taking aspirin powders routinely. Concern for gastritis or PUD. Start Protonix daily. No melena noted.

## 2012-08-19 NOTE — Assessment & Plan Note (Signed)
Start Protonix once daily. STOP aspirin powders.

## 2012-08-22 NOTE — Progress Notes (Signed)
Faxed to PCP

## 2012-08-23 NOTE — Progress Notes (Signed)
Quick Note:  Normal TSH, negative celiac serologies. Has patient returned the stool sample for GI pathogen? If negative stool studies, we will need to do TCS. ______

## 2012-10-10 NOTE — Progress Notes (Signed)
Negative stool studies reviewed.  How is patient doing? Need to bring in for follow-up and to set up TCS with RMR due to ?colitis on CT.

## 2012-10-11 NOTE — Progress Notes (Signed)
Tried to call pt- NA and voice mailbox was full

## 2012-10-12 LAB — CLOSTRIDIUM DIFFICILE BY PCR: Toxigenic C. Difficile by PCR: NEGATIVE

## 2012-10-12 NOTE — Progress Notes (Signed)
Tried to call pt- Sarah Collins, mailed letter to pt.

## 2013-03-12 ENCOUNTER — Emergency Department (HOSPITAL_COMMUNITY)
Admission: EM | Admit: 2013-03-12 | Discharge: 2013-03-13 | Disposition: A | Discharge status: Home / Self Care | Payer: BC Managed Care – PPO | Attending: Emergency Medicine | Admitting: Emergency Medicine

## 2013-03-12 ENCOUNTER — Encounter (HOSPITAL_COMMUNITY): Payer: Self-pay | Admitting: Emergency Medicine

## 2013-03-12 DIAGNOSIS — G43909 Migraine, unspecified, not intractable, without status migrainosus: Secondary | ICD-10-CM | POA: Insufficient documentation

## 2013-03-12 DIAGNOSIS — Z79899 Other long term (current) drug therapy: Secondary | ICD-10-CM | POA: Insufficient documentation

## 2013-03-12 DIAGNOSIS — R112 Nausea with vomiting, unspecified: Secondary | ICD-10-CM | POA: Insufficient documentation

## 2013-03-12 DIAGNOSIS — K589 Irritable bowel syndrome without diarrhea: Secondary | ICD-10-CM | POA: Insufficient documentation

## 2013-03-12 DIAGNOSIS — Z8742 Personal history of other diseases of the female genital tract: Secondary | ICD-10-CM | POA: Insufficient documentation

## 2013-03-12 DIAGNOSIS — R51 Headache: Secondary | ICD-10-CM | POA: Insufficient documentation

## 2013-03-12 MED ORDER — SODIUM CHLORIDE 0.9 % IV BOLUS (SEPSIS)
1000.0000 mL | Freq: Once | INTRAVENOUS | Status: AC
  Administered 2013-03-13: 1000 mL via INTRAVENOUS

## 2013-03-12 MED ORDER — PROMETHAZINE HCL 25 MG/ML IJ SOLN
25.0000 mg | Freq: Once | INTRAMUSCULAR | Status: AC
  Administered 2013-03-12: 25 mg via INTRAVENOUS
  Filled 2013-03-12: qty 1

## 2013-03-12 MED ORDER — DEXAMETHASONE SODIUM PHOSPHATE 4 MG/ML IJ SOLN
10.0000 mg | Freq: Once | INTRAMUSCULAR | Status: AC
  Administered 2013-03-12: 10 mg via INTRAVENOUS
  Filled 2013-03-12: qty 3

## 2013-03-12 MED ORDER — SODIUM CHLORIDE 0.9 % IV BOLUS (SEPSIS)
1000.0000 mL | Freq: Once | INTRAVENOUS | Status: AC
  Administered 2013-03-12: 1000 mL via INTRAVENOUS

## 2013-03-12 MED ORDER — DIPHENHYDRAMINE HCL 50 MG/ML IJ SOLN
25.0000 mg | Freq: Once | INTRAMUSCULAR | Status: AC
  Administered 2013-03-12: 25 mg via INTRAVENOUS
  Filled 2013-03-12: qty 1

## 2013-03-12 NOTE — ED Notes (Signed)
Pt c/o severe headache and n/v x 2 hrs. Pt has a history of migraines.

## 2013-03-12 NOTE — ED Provider Notes (Signed)
CSN: [id number]     Arrival date & time 03/12/13  2231 History   None   Scribed for Sunnie Nielsen, MD, the patient was seen in room APA11/APA11. This chart was scribed by Lewanda Rife, ED scribe. Patient's care was started at 11:40 PM  Chief Complaint  Patient presents with  . Headache  . Nausea  . Emesis   (Consider location/radiation/quality/duration/timing/severity/associated sxs/prior Treatment) The history is provided by the patient. No language interpreter was used.   HPI Comments: Sarah Collins is a 27 y.o. female who presents to the Emergency Department with PMHx of migraines complaining of severe migrane radiating to left neck onset 3 hours ago. Describes migraine as diffuse. Reports nausea, emesis, and diplopia. Denies associated fever, numbness, and weakness. Denies coming to ED for migraines in the past and sleep usually alleviates symptoms. Denies recent travels and sick contacts. Reports she takes propanolol for migraines and has been taking it daily as prescribed.   Symptoms tonight feel like a typical migraine with photophobia and nausea.   PCP Dr. Reuel Boom    Past Medical History  Diagnosis Date  . Diverticulitis   . Ovarian cyst     left ovary  . IBS (irritable bowel syndrome)   . Migraine headache    Past Surgical History  Procedure Laterality Date  . Cholecystectomy      age 46  . Tonsillectomy    . Vaginal hematoma repair     Family History  Problem Relation Age of Onset  . Colon cancer Neg Hx    History  Substance Use Topics  . Smoking status: Never Smoker   . Smokeless tobacco: Not on file  . Alcohol Use: No   OB History   Grav Para Term Preterm Abortions TAB SAB Ect Mult Living   1 1 1             Review of Systems  Gastrointestinal: Positive for nausea and vomiting.  Neurological: Positive for headaches.  All other systems reviewed and are negative.  A complete 10 system review of systems was obtained and all systems are negative  except as noted in the HPI and PMHx.     Allergies  Codeine; Morphine and related; and Zofran  Home Medications   Current Outpatient Rx  Name  Route  Sig  Dispense  Refill  . ciprofloxacin (CIPRO) 500 MG tablet   Oral   Take 500 mg by mouth 2 (two) times daily. For 5 days (picked up on 08/10/12) have not started         . citalopram (CELEXA) 40 MG tablet   Oral   Take 40 mg by mouth at bedtime.         . dicyclomine (BENTYL) 10 MG capsule   Oral   Take 1 capsule (10 mg total) by mouth 4 (four) times daily -  before meals and at bedtime.   120 capsule   3   . HYDROcodone-acetaminophen (NORCO/VICODIN) 5-325 MG per tablet      1 or 2 tabs PO q6 hours prn pain   20 tablet   0   . pantoprazole (PROTONIX) 20 MG tablet   Oral   Take 1 tablet (20 mg total) by mouth daily.   30 tablet   3   . promethazine (PHENERGAN) 25 MG tablet   Oral   Take 1 tablet (25 mg total) by mouth every 6 (six) hours as needed for nausea.   8 tablet   0   .  propranolol ER (INDERAL LA) 60 MG 24 hr capsule   Oral   Take 60 mg by mouth daily.          BP 150/116  Pulse 98  Resp 24  Ht 5\' 6"  (1.676 m)  Wt 195 lb (88.451 kg)  BMI 31.49 kg/m2  SpO2 100%  LMP 02/11/2013 Physical Exam  Nursing note and vitals reviewed. Constitutional: She is oriented to person, place, and time. She appears well-developed and well-nourished. No distress.  HENT:  Head: Normocephalic and atraumatic.  Oropharynx is clear  Eyes: Conjunctivae and EOM are normal. Pupils are equal, round, and reactive to light. No scleral icterus.  Neck: Normal range of motion. Neck supple. No tracheal deviation present.  No nuchal rigidity   Cardiovascular: Normal rate and regular rhythm.   No murmur heard. Pulmonary/Chest: Effort normal and breath sounds normal. No respiratory distress. She has no wheezes. She has no rales.  Abdominal: Soft. There is no rigidity, no CVA tenderness and no tenderness at McBurney's point.   Musculoskeletal: Normal range of motion.  Neurological: She is alert and oriented to person, place, and time. She has normal strength. No cranial nerve deficit or sensory deficit. Gait normal.  No focal deficits. No facial drop. Bilateral 5/5 grip strength. Intact and symmetrical biceps strength, triceps strength, ankle plantar and dorsiflexion.    Skin: Skin is warm and dry. No rash noted.  Psychiatric: She has a normal mood and affect. Her behavior is normal.    ED Course  Procedures (including critical care time)  COORDINATION OF CARE:  Nursing notes reviewed. Vital signs reviewed. Initial pt interview and examination performed.   11:48 PM-Discussed treatment plan with pt at bedside, which includes migraine cocktail . Pt agrees with plan.   Treatment plan initiated: Medications  sodium chloride 0.9 % bolus 1,000 mL (not administered)  sodium chloride 0.9 % bolus 1,000 mL (1,000 mLs Intravenous New Bag/Given 03/12/13 2357)  promethazine (PHENERGAN) injection 25 mg (25 mg Intravenous Given 03/12/13 2358)  dexamethasone (DECADRON) injection 10 mg (10 mg Intravenous Given 03/12/13 2358)  diphenhydrAMINE (BENADRYL) injection 25 mg (25 mg Intravenous Given 03/12/13 2359)    2:02 AM recheck is feeling much better and is requesting to be discharged home. Ambulates to the bathroom no acute distress. Tolerates ginger ale without any further nausea or vomiting.  Plan discharge home with prescription for Phenergan provided. She will continue propranolol. Patient agrees to strict return precautions. She'll followup with her primary care physician as needed and return here for any concerning symptoms.   MDM  Diagnosis: Headache  Improved with IV fluids and HA cocktail  I personally performed the services described in this documentation, which was scribed in my presence. The recorded information has been reviewed and is accurate.     Sunnie Nielsen, MD 03/13/13 [phone]

## 2013-03-13 MED ORDER — PROMETHAZINE HCL 25 MG PO TABS: 25.0000 mg | ORAL_TABLET | Freq: Four times a day (QID) | ORAL | Status: DC | PRN

## 2013-03-13 MED ORDER — HYDROMORPHONE HCL PF 1 MG/ML IJ SOLN
1.0000 mg | Freq: Once | INTRAMUSCULAR | Status: AC
  Administered 2013-03-13: 1 mg via INTRAVENOUS
  Filled 2013-03-13: qty 1

## 2013-03-13 NOTE — ED Notes (Signed)
Pt and family made aware that the doctor was in an emergent situation.

## 2013-04-06 ENCOUNTER — Other Ambulatory Visit: Payer: Self-pay

## 2013-05-11 ENCOUNTER — Ambulatory Visit (INDEPENDENT_AMBULATORY_CARE_PROVIDER_SITE_OTHER): Payer: BC Managed Care – PPO | Admitting: Neurology

## 2013-05-11 ENCOUNTER — Encounter: Payer: Self-pay | Admitting: Neurology

## 2013-05-11 VITALS — BP 121/80 | HR 82 | Temp 98.1°F | Ht 66.0 in | Wt 202.0 lb

## 2013-05-11 DIAGNOSIS — R209 Unspecified disturbances of skin sensation: Secondary | ICD-10-CM

## 2013-05-11 DIAGNOSIS — M5412 Radiculopathy, cervical region: Secondary | ICD-10-CM | POA: Insufficient documentation

## 2013-05-11 DIAGNOSIS — R51 Headache: Secondary | ICD-10-CM

## 2013-05-11 DIAGNOSIS — R200 Anesthesia of skin: Secondary | ICD-10-CM

## 2013-05-11 DIAGNOSIS — G43019 Migraine without aura, intractable, without status migrainosus: Secondary | ICD-10-CM | POA: Insufficient documentation

## 2013-05-11 MED ORDER — TIZANIDINE HCL 2 MG PO CAPS: 2.0000 mg | ORAL_CAPSULE | Freq: Every evening | ORAL | Status: DC | PRN

## 2013-05-11 MED ORDER — ELETRIPTAN HYDROBROMIDE 40 MG PO TABS: 40.0000 mg | ORAL_TABLET | ORAL | Status: DC | PRN

## 2013-05-11 NOTE — Progress Notes (Signed)
[facility] [address] [place]. [place] [address] [phone]       OFFICE CONSULT NOTE  Sarah. Sarah Collins Date of Birth:  06-18-1985 Medical Record Number:  [id number]   Referring MD:  Donzetta Sprung Reason for Referral:  Headaches and numbness HPI: Sarah Collins is a 65 year pleasant Caucasian lady who's had migraine headaches for last 3-4 years which seem to be progressively getting worse and are harder to treat. She reports at least one severe bad headache per week and 3-4 mild headaches per week which are moderate 6-7/10 in severity. Headache usually starts at the back of the neck on the right side and comes forward in the form of a band involving the right temple since the last eye. Occasionally she gets a shooting pain from the back of her neck going down the side of the neck into her arm and occasionally into the third and fourth fingers. She also has occasional tingling and numbness going down her arm in the same distribution. She hasn't vision symptoms at the onset when she sees lights and patterns going across. The only triggers she can identified include lack of sleep, menstrual periods and stress. The headache does increase his physical activity and bending down and is relieved partial degree by lying down and sleeping. She has tried Tylenol, Motrin and and will for symptomatic relief but it does not work. She has tried Topamax, propranolol, amitriptyline, nortriptyline for prophylaxis but they all work short-term and she stopped them. She is currently taking propranolol 60 mg daily but feels it's not helping. She does also complain of tightness in the back of her neck. 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 She has not tried to come medications for headache or muscle relaxants like Zanaflex. She recently underwent MRI scan of the neck on 03/28/13 which showed mild tonsillar ectopia but no significant foraminal  stenosis or.root  cord compression. She has not had any brain imaging studies done. She does not participate in any regular activities for stress relaxation  ROS:   14 system review of systems is positive for headaches, numbness, neck pain and visual scotoma only  PMH:  Past Medical History  Diagnosis Date  . Diverticulitis   . Ovarian cyst     left ovary  . IBS (irritable bowel syndrome)   . Migraine headache     Social History:  History   Social History  . Marital Status: Single    Spouse Name: N/A    Number of Children: 1  . 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 Smoking status: Never Smoker   . Smokeless tobacco: Never Used  . Alcohol Use: No  . Drug Use: No  . Sexual Activity: Not on file   Other Topics Concern  . Not on file   Social History Narrative   Patient lives at home with family.   Caffeine Use: 5-7 sodas daily    Medications:   Current Outpatient Prescriptions on File Prior to Visit  Medication Sig Dispense Refill  . citalopram (CELEXA) 40 MG tablet Take 40 mg by mouth at bedtime.      . propranolol ER (INDERAL LA) 60 MG 24 hr capsule Take 60 mg by mouth daily.       No current facility-administered medications on file prior to visit.  Allergies:   Allergies  Allergen Reactions  . Codeine Hives  . Magnesium-Containing Compounds     Pt prefers not taking this med.  . Morphine And Related Hives  . Toradol [Ketorolac Tromethamine]   . Zofran [Ondansetron] Hives    Only to IV Zofran, patient can take tablets    Physical Exam General: well developed, well nourished, obese caucasian lady seated, in no evident distress Head: head normocephalic and atraumatic. Orohparynx benign Neck: supple with no carotid or supraclavicular bruits Cardiovascular: regular rate and rhythm, no murmurs Musculoskeletal: no deformity Skin:  no rash/petichiae Vascular:  Normal pulses all  extremities Filed Vitals:   05/11/13 1255  BP: 121/80  Pulse: 82  Temp: 98.1 F (36.7 C)    Neurologic Exam Mental Status: Awake and fully alert. Oriented to place and time. Recent and remote memory intact. Attention span, concentration and fund of knowledge appropriate. Mood and affect appropriate.  Cranial Nerves: Fundoscopic exam reveals sharp disc margins. Pupils equal, briskly reactive to light. Extraocular movements full without nystagmus. Visual fields full to confrontation. Hearing intact. Facial sensation intact. Face, tongue, palate moves normally and symmetrically.  Motor: Normal bulk and tone. Normal strength in all tested extremity muscles. Sensory.: intact to touch and pinprick and vibratory sensation. Mild subjective diminished touch pinprick sensation in the right hand in the third and fourth fingers on the ventral aspect only.. Tinel sign is positive over the right ulnar groove and right wrist Coordination: Rapid alternating movements normal in all extremities. Finger-to-nose and heel-to-shin performed accurately bilaterally. Gait and Station: Arises from chair without difficulty. Stance is normal. Gait demonstrates normal stride length and balance . Able to heel, toe and tandem walk without difficulty.  Reflexes: 1+ and symmetric. Toes downgoing.    ASSESSMENT: 85 year Caucasian lady with history of migraine headaches which have gotten progressively worse over the last 3 years as well as intermittent right arm parasthesias of unclear etiology possibly cervical  C 7 radiculopathy versus ulnar/median entrapment neuropathy.    PLAN: I had a long discussion with the patient and her mother regarding her headaches as well as some numbness, discuss my findings, plan for evaluation, treatment and answered questions. She was given samples of Relpax 40 mg to try for symptomatic relief and if effective fill the prescription. Start Zanaflex 2 mg at night for one week increase to 4 mg  if tolerated for headache prophylaxis. Continue propranolol 60 mg daily for now. I advised her to increase participation in activities for stress relaxation like regular exercise, swimming, meditation and yoga. She was also advised to do regular neck stretching exercises.Check MRI scan of the brain with and without contrast and EMG nerve conduction study for her hand paresthesias. Consider Botox for migraine prophylaxis after insurance approval. Return for followup in 6 weeks or call earlier if necessary.

## 2013-05-11 NOTE — Patient Instructions (Signed)
She was given samples of Relpax 40 mg to try for symptomatic relief and if effective fill the prescription. Start Zanaflex 2 mg at night for one week increase to 4 mg if tolerated for headache prophylaxis. Continue propranolol 60 mg daily for now. I advised her to increase participation in activities for stress relaxation like regular exercise, swimming, meditation and yoga. She was also advised to do regular neck stretching exercises.Check MRI scan of the brain with and without contrast and EMG nerve conduction study for her hand paresthesias. Consider Botox for migraine prophylaxis after insurance approval. Return for followup in 6 weeks or call earlier if necessary.

## 2013-05-12 ENCOUNTER — Other Ambulatory Visit: Payer: Self-pay | Admitting: Neurology

## 2013-05-12 NOTE — Telephone Encounter (Signed)
Rx faxed over to [facility] at [phone].

## 2013-05-18 ENCOUNTER — Encounter: Payer: BC Managed Care – PPO | Admitting: Neurology

## 2013-05-18 ENCOUNTER — Encounter: Payer: BC Managed Care – PPO | Admitting: Radiology

## 2013-05-29 ENCOUNTER — Inpatient Hospital Stay: Admission: RE | Admit: 2013-05-29 | Payer: BC Managed Care – PPO | Source: Ambulatory Visit

## 2013-06-04 ENCOUNTER — Inpatient Hospital Stay: Admission: RE | Admit: 2013-06-04 | Payer: BC Managed Care – PPO | Source: Ambulatory Visit

## 2013-06-05 ENCOUNTER — Ambulatory Visit (INDEPENDENT_AMBULATORY_CARE_PROVIDER_SITE_OTHER): Payer: BC Managed Care – PPO | Admitting: Neurology

## 2013-06-05 ENCOUNTER — Encounter (INDEPENDENT_AMBULATORY_CARE_PROVIDER_SITE_OTHER): Payer: BC Managed Care – PPO

## 2013-06-05 DIAGNOSIS — G5601 Carpal tunnel syndrome, right upper limb: Secondary | ICD-10-CM

## 2013-06-05 DIAGNOSIS — Z0289 Encounter for other administrative examinations: Secondary | ICD-10-CM

## 2013-06-05 DIAGNOSIS — G5600 Carpal tunnel syndrome, unspecified upper limb: Secondary | ICD-10-CM

## 2013-06-05 DIAGNOSIS — R51 Headache: Secondary | ICD-10-CM

## 2013-06-05 NOTE — Procedures (Signed)
     HISTORY:  Sarah Collins is a 28 year old patient with a six-month history of numbness in the right hand associated with discomfort. The patient has some discomfort into the right shoulder as well. The patient gives a history of dislocation of the right shoulder in May of 2014.  NERVE CONDUCTION STUDIES:  Nerve conduction studies were performed on both upper extremities. The distal motor latencies for the median nerves were prolonged on the right, normal on the left, with normal motor amplitudes for these nerves bilaterally. The distal motor latencies and motor amplitudes for the ulnar nerves were normal bilaterally. The F wave latencies and nerve conduction velocities for the median and ulnar nerves were normal bilaterally. The sensory latency for the right median nerve was prolonged, normal for the left median nerve and normal for the ulnar nerves bilaterally.  EMG STUDIES:  EMG study was performed on the right upper extremity:  The first dorsal interosseous muscle reveals 2 to 4 K units with full recruitment. No fibrillations or positive waves were noted. The abductor pollicis brevis muscle reveals 2 to 4 K units with full recruitment. No fibrillations or positive waves were noted. The extensor indicis proprius muscle reveals 1 to 3 K units with full recruitment. No fibrillations or positive waves were noted. The pronator teres muscle reveals 2 to 3 K units with full recruitment. No fibrillations or positive waves were noted. The biceps muscle reveals 1 to 2 K units with full recruitment. No fibrillations or positive waves were noted. The triceps muscle reveals 2 to 4 K units with full recruitment. No fibrillations or positive waves were noted. The anterior deltoid muscle reveals 2 to 3 K units with full recruitment. No fibrillations or positive waves were noted. The cervical paraspinal muscles were tested at 2 levels. No abnormalities of insertional activity were seen at either level  tested. There was good relaxation.   IMPRESSION:  Nerve conduction studies done on both upper extremities shows evidence of a mild right carpal tunnel syndrome. No other significant abnormalities were seen. EMG evaluation of the right upper extremity was unremarkable, without evidence of an overlying cervical radiculopathy.  Marlan Palau. Keith Dreonna Hussein MD 06/05/2013 1:28 PM  [facility] [address] [place] [id number]  Phone [phone] Fax [phone]

## 2013-06-08 ENCOUNTER — Telehealth: Payer: Self-pay | Admitting: Neurology

## 2013-06-08 ENCOUNTER — Ambulatory Visit
Admission: RE | Admit: 2013-06-08 | Discharge: 2013-06-08 | Disposition: A | Discharge status: Home / Self Care | Payer: BC Managed Care – HMO | Source: Ambulatory Visit | Attending: Neurology | Admitting: Neurology

## 2013-06-08 DIAGNOSIS — R51 Headache: Secondary | ICD-10-CM

## 2013-06-08 MED ORDER — GADOBENATE DIMEGLUMINE 529 MG/ML IV SOLN
19.0000 mL | Freq: Once | INTRAVENOUS | Status: AC | PRN
  Administered 2013-06-08: 19 mL via INTRAVENOUS

## 2013-06-08 NOTE — Telephone Encounter (Signed)
I called the patient, one over the results of the EMG nerve conduction studies. The patient has a mild right carpal tunnel syndrome. I am assuming that she has not heard from Dr. Pearlean BrownieSethi, I will send another note to him.

## 2013-06-08 NOTE — Telephone Encounter (Signed)
NEEDS RESULTS OF NCV/EMG--NEEDS TO DISCUSS

## 2013-06-09 ENCOUNTER — Telehealth: Payer: Self-pay | Admitting: Neurology

## 2013-06-09 NOTE — Telephone Encounter (Signed)
Patient called stating someone called her she doesn't know who because she could not understand message and there was no documentation of a phonecall. I advised her I would send a message

## 2013-06-12 NOTE — Telephone Encounter (Signed)
Called and left message that doctor had called her about her EMG/NCV results, and to call back for results

## 2013-06-13 ENCOUNTER — Ambulatory Visit: Payer: Self-pay | Admitting: Neurology

## 2013-06-13 NOTE — Telephone Encounter (Signed)
Wrist splint nright hand trial and avoid rapid repititive flexion wrist movements.

## 2013-06-15 NOTE — Telephone Encounter (Addendum)
I called pt back after consulting Dr. Pearlean BrownieSethi.  Trial of wrist splint for another 2 wks.  If improves , use splints as needed.  If not may need injection or surgical eval.  Pt verbalized understanding. Also relayed that in Dr. Marlis EdelsonSethi's note it does speak of MRI neck, mild tonsillat extopia with no significant foraminal stenosis or root cord compression.

## 2013-06-15 NOTE — Telephone Encounter (Signed)
I called pt and relayed that splint for R wrist on a trial basis and see if makes a difference.  How long??  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  She had MRI done previously and was not aware of this).  Would check on trial period and MRI result/sx)

## 2013-07-26 ENCOUNTER — Encounter: Payer: Self-pay | Admitting: Neurology

## 2013-07-26 ENCOUNTER — Ambulatory Visit (INDEPENDENT_AMBULATORY_CARE_PROVIDER_SITE_OTHER): Payer: BC Managed Care – PPO | Admitting: Neurology

## 2013-07-26 VITALS — BP 130/86 | HR 87 | Ht 66.0 in | Wt 200.0 lb

## 2013-07-26 DIAGNOSIS — Q0700 Arnold-Chiari syndrome without spina bifida or hydrocephalus: Secondary | ICD-10-CM

## 2013-07-26 DIAGNOSIS — G43719 Chronic migraine without aura, intractable, without status migrainosus: Secondary | ICD-10-CM

## 2013-07-26 NOTE — Progress Notes (Signed)
[facility] [address] [place]. [place] [address] [phone]       OFFICE Follow Up NOTE  Sarah Collins Date of Birth:  08-24-1985 Medical Record Number:  [id number]   Referring MD:  Donzetta Sprung Reason for Referral:  Headaches and numbness HPI:  Update 07/26/13 ; she returns for followup after her last visit 3 months ago. She continues to have headaches 3-4 times per week. She finds Zanaflex helps her at night and she can sleep well but she cannot tolerate it during the daytime. She remains on Inderal 60 mg daily. She finds Relpax is helpful and but she can take it only 2 days per week. She continues to take Excedrin on other days and has not discontinued it despite my instructions. She has approval for Botox and wants to try it today.I went over the risk benefits and side effects of Botox for the patient and answered all her questions. MRI scan of the brain on 06/08/13 showed mild Arnold-Chiari malformation but no other structural abnormalities. Nerve conduction EMG study showed mild right carpal tunnel syndrome.. She denies any significant disabling paresthesias in the right hand or weakness. Initial Consult 05/11/2013 ;Sarah Collins is a 31 year pleasant Caucasian lady who's had migraine headaches for last 3-4 years which seem to be progressively getting worse and are harder to treat. She reports at least one severe bad headache per week and 3-4 mild headaches per week which are moderate 6-7/10 in severity. Headache usually starts at the back of the neck on the right side and comes forward in the form of a band involving the right temple since the last eye. Occasionally she gets a shooting pain from the back of her neck going down the side of the neck into her arm and occasionally into the third and fourth fingers. She also has occasional tingling and numbness going down her arm in the same distribution. She hasn't vision symptoms at the onset when she sees lights and  patterns going across. The only triggers she can identified include lack of sleep, menstrual periods and stress. The headache does increase his physical activity and bending down and is relieved partial degree by lying down and sleeping. She has tried Tylenol, Motrin and and will for symptomatic relief but it does not work. She has tried Topamax, propranolol, amitriptyline, nortriptyline for prophylaxis but they all work short-term and she stopped them. She is currently taking propranolol 60 mg daily but feels it's not helping. She does also complain of tightness in the back of her neck. 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 She has not tried to come medications for headache or muscle relaxants like Zanaflex. She recently underwent MRI scan of the neck on 03/28/13 which showed mild tonsillar ectopia but no significant foraminal stenosis or.root  cord compression. She has not had any brain imaging studies done. She does not participate in any regular activities for stress relaxation  ROS:   14 system review of systems is positive for headaches, numbness, neck pain and visual scotoma only  PMH:  Past Medical History  Diagnosis Date  . Diverticulitis   . Ovarian cyst     left ovary  . IBS (irritable bowel syndrome)   . Migraine headache     Social History:  History   Social History  . Marital Status: Single    Spouse Name: N/A    Number of Children: 1  .  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 Smoking status: Never Smoker   . Smokeless tobacco: Never Used  . Alcohol Use: No  . Drug Use: No  . Sexual Activity: Not on file   Other Topics Concern  . Not on file   Social History Narrative   Patient lives at home with family.   Caffeine Use: 5-7 sodas daily    Medications:   Current Outpatient Prescriptions on File Prior to Visit  Medication Sig Dispense  Refill  . citalopram (CELEXA) 40 MG tablet Take 40 mg by mouth at bedtime.      Marland Kitchen eletriptan (RELPAX) 40 MG tablet Take 1 tablet (40 mg total) by mouth as needed for migraine or headache. One tablet by mouth at onset of headache. May repeat in 2 hours if headache persists or recurs.  10 tablet  0  . propranolol ER (INDERAL LA) 60 MG 24 hr capsule Take 60 mg by mouth daily.      . tizanidine (ZANAFLEX) 2 MG capsule Take 1 capsule (2 mg total) by mouth at bedtime as needed and may repeat dose one time if needed for muscle spasms.  60 capsule  1   No current facility-administered medications on file prior to visit.    Allergies:   Allergies  Allergen Reactions  . Codeine Hives  . Magnesium-Containing Compounds     Pt prefers not taking this med.  . Morphine And Related Hives  . Toradol [Ketorolac Tromethamine]   . Zofran [Ondansetron] Hives    Only to IV Zofran, patient can take tablets    Physical Exam General: well developed, well nourished, obese caucasian lady seated, in no evident distress Head: head normocephalic and atraumatic. Orohparynx benign Neck: supple with no carotid or supraclavicular bruits Cardiovascular: regular rate and rhythm, no murmurs Musculoskeletal: no deformity Skin:  no rash/petichiae Vascular:  Normal pulses all extremities Filed Vitals:   07/26/13 1449  BP: 130/86  Pulse: 87    Neurologic Exam Mental Status: Awake and fully alert. Oriented to place and time. Recent and remote memory intact. Attention span, concentration and fund of knowledge appropriate. Mood and affect appropriate.  Cranial Nerves: Fundoscopic exam  not done. Pupils equal, briskly reactive to light. Extraocular movements full without nystagmus. Visual fields full to confrontation. Hearing intact. Facial sensation intact. Face, tongue, palate moves normally and symmetrically.  Motor: Normal bulk and tone. Normal strength in all tested extremity muscles. Sensory.: intact to touch and  pinprick and vibratory sensation. Mild subjective diminished touch pinprick sensation in the right hand in the third and fourth fingers on the ventral aspect only.. Tinel sign is positive over the right ulnar groove and right wrist Coordination: Rapid alternating movements normal in all extremities. Finger-to-nose and heel-to-shin performed accurately bilaterally. Gait and Station: Arises from chair without difficulty. Stance is normal. Gait demonstrates normal stride length and balance . Able to heel, toe and tandem walk without difficulty.  Reflexes: 1+ and symmetric. Toes downgoing.    ASSESSMENT: 11 year Caucasian lady with history of migraine headaches which have gotten progressively worse over the last 3 years as well as intermittent right arm parasthesias from mild right carpal tunnel. Incidental mild Arnold-Chiari malformation likely unrelated to her symptoms.   PLAN: I had a long discussion with the patient   regarding her headaches as well as some numbness, discuss my findings, plan for evaluation, treatment  and answered questions.  She was advised to continue Relpax for symptomatic relief of her migraines and Zanaflex 2 mg at night for prophylaxis. She'll advise to maintain a strict headache diary and to stop over-the-counter analgesics to decrease tenderness and rebound headache. They will try Botox today and if beneficial given every 3 months for migraine prophylaxis. She was given written refills for Relpax and Zanaflex. As per her request.She was advised to wear right wrist splint for carpal tunnel and limit activities with rapid repititive right wrist flexion.  BOTOX PROCEDURE NOTE :  After taking written informed consent from the patient explaining risk benefit specifically pain, infection, bleeding and muscle paralysis 200 units of Botox were administered intramuscularly into the face and neck muscles under aseptic precautions. The orbicularis, mentalis, frontalis, temporalis,  occipitalis, posterior neck and upper scapular muscles were injected at 31 sites as per the migraine Botox protocol. The remaining injection sites were at trigger points shown by the patient. A total of 200 units were injected with no amount wasted. The lot number was [id number]  C3 with expiry date April 2017.The patient tolerated the procedure well without any major complications noted.

## 2013-07-26 NOTE — Patient Instructions (Addendum)
She was advised to continue Relpax for symptomatic relief of her migraines and Zanaflex 2 mg at night for prophylaxis. She'll advise to maintain a strict headache diary and to stop over-the-counter analgesics to decrease tenderness and rebound headache. They will try Botox today and if beneficial given every 3 months for migraine prophylaxis. She was given written refills for Relpax and Zanaflex. As per her request.She was advised to wear right wrist splint for carpal tunnel and limit activities with rapid repititive right wrist flexion.

## 2013-08-15 ENCOUNTER — Telehealth: Payer: Self-pay | Admitting: Neurology

## 2013-08-15 NOTE — Telephone Encounter (Signed)
Patient is calling stating that she takes Zanaflex for migraines and she has had botox injections which has helped the migraines, patient wants to know if she could stop taking the Zanaflex because it is very expensive. Please advise.

## 2013-08-15 NOTE — Telephone Encounter (Signed)
Ok but taper zanaflex to 2 mg hs x 2 weeks then every other day for 2 weeks and stop

## 2013-08-15 NOTE — Telephone Encounter (Signed)
Patient is on Zanaflex for migraines--patient has had botox injections which has helped migraines-can patient discontinue Zanaflex because it is very expensive?

## 2013-08-16 NOTE — Telephone Encounter (Signed)
Called patient to inform her that Dr. Pearlean BrownieSethi stated that it was ok to taper off Zanaflex and that the doctor wanted her to start taking Zanaflex 2 mg at bedtime for 2 weeks and then take Zanaflex 2 mg every other day for 2 weeks and then stop. I advised that patient that if she has any other problems, questions or concerns to call the office. Patient verbalized understanding.

## 2013-08-16 NOTE — Telephone Encounter (Signed)
Patient returning your call.

## 2013-12-08 ENCOUNTER — Encounter: Payer: BC Managed Care – PPO | Admitting: Cardiology

## 2013-12-08 NOTE — Progress Notes (Signed)
ERROR

## 2013-12-19 ENCOUNTER — Ambulatory Visit (INDEPENDENT_AMBULATORY_CARE_PROVIDER_SITE_OTHER): Payer: BC Managed Care – HMO | Admitting: Cardiology

## 2013-12-19 ENCOUNTER — Encounter: Payer: Self-pay | Admitting: Cardiology

## 2013-12-19 VITALS — BP 136/88 | HR 85 | Ht 66.0 in | Wt 208.0 lb

## 2013-12-19 DIAGNOSIS — R0989 Other specified symptoms and signs involving the circulatory and respiratory systems: Secondary | ICD-10-CM

## 2013-12-19 DIAGNOSIS — R002 Palpitations: Secondary | ICD-10-CM

## 2013-12-19 DIAGNOSIS — R0600 Dyspnea, unspecified: Secondary | ICD-10-CM

## 2013-12-19 DIAGNOSIS — R0609 Other forms of dyspnea: Secondary | ICD-10-CM

## 2013-12-19 NOTE — Progress Notes (Signed)
Clinical Summary Ms. Job FoundsDockery is a 28 y.o.female seen today as a new patient for palpitations.  1. Palpitations - started approx few months ago. Feeling of heart pounding, fullness in chest. + SOB, + dizziness, feels shaky, diaphoretic. Can occur at rest or with exertion. Lasts for an hour or so - coffee 3 cups of coffee, iced tea 2-3 glasses, 1 soda day, no energy drinks. No alcohol - episodes occur 2-3 times a week, increasing in frequency - has noted some SOB with activity, example pushing bed at work over the last few months.  - denies any exertional chest pain. No LE edema. 3 pillow orthopnea for several years.   FH: father afib, maternal grandmother afib, maternal grandfather CABG.   Past Medical History  Diagnosis Date  . Diverticulitis   . Ovarian cyst     left ovary  . IBS (irritable bowel syndrome)   . Migraine headache      Allergies  Allergen Reactions  . Codeine Hives  . Magnesium-Containing Compounds     Pt prefers not taking this med.  . Morphine And Related Hives  . Toradol [Ketorolac Tromethamine]   . Zofran [Ondansetron] Hives    Only to IV Zofran, patient can take tablets     Current Outpatient Prescriptions  Medication Sig Dispense Refill  . citalopram (CELEXA) 40 MG tablet Take 40 mg by mouth at bedtime.      Marland Kitchen. eletriptan (RELPAX) 40 MG tablet Take 1 tablet (40 mg total) by mouth as needed for migraine or headache. One tablet by mouth at onset of headache. May repeat in 2 hours if headache persists or recurs.  10 tablet  0  . propranolol ER (INDERAL LA) 60 MG 24 hr capsule Take 60 mg by mouth daily.      . tizanidine (ZANAFLEX) 2 MG capsule Take 1 capsule (2 mg total) by mouth at bedtime as needed and may repeat dose one time if needed for muscle spasms.  60 capsule  1   No current facility-administered medications for this visit.     Past Surgical History  Procedure Laterality Date  . Cholecystectomy      age 28  . Tonsillectomy    .  Vaginal hematoma repair       Allergies  Allergen Reactions  . Codeine Hives  . Magnesium-Containing Compounds     Pt prefers not taking this med.  . Morphine And Related Hives  . Toradol [Ketorolac Tromethamine]   . Zofran [Ondansetron] Hives    Only to IV Zofran, patient can take tablets      Family History  Problem Relation Age of Onset  . Colon cancer Neg Hx      Social History Ms. Job FoundsDockery reports that she has never smoked. She has never used smokeless tobacco. Ms. Job FoundsDockery reports that she does not drink alcohol.   Review of Systems CONSTITUTIONAL: No weight loss, fever, chills, weakness or fatigue.  HEENT: Eyes: No visual loss, blurred vision, double vision or yellow sclerae.No hearing loss, sneezing, congestion, runny nose or sore throat.  SKIN: No rash or itching.  CARDIOVASCULAR: per HPI RESPIRATORY: No shortness of breath, cough or sputum.  GASTROINTESTINAL: No anorexia, nausea, vomiting or diarrhea. No abdominal pain or blood.  GENITOURINARY: No burning on urination, no polyuria NEUROLOGICAL: No headache, dizziness, syncope, paralysis, ataxia, numbness or tingling in the extremities. No change in bowel or bladder control.  MUSCULOSKELETAL: No muscle, back pain, joint pain or stiffness.  LYMPHATICS: No enlarged  nodes. No history of splenectomy.  PSYCHIATRIC: No history of depression or anxiety.  ENDOCRINOLOGIC: No reports of sweating, cold or heat intolerance. No polyuria or polydipsia.  Marland Kitchen   Physical Examination p 85 bp 136/88 Wt 208 lbs BMI 34 Gen: resting comfortably, no acute distress HEENT: no scleral icterus, pupils equal round and reactive, no palptable cervical adenopathy,  CV: RRR, no m/r/g, no JVD Resp: Clear to auscultation bilaterally GI: abdomen is soft, non-tender, non-distended, normal bowel sounds, no hepatosplenomegaly MSK: extremities are warm, no edema.  Skin: warm, no rash Neuro:  no focal deficits Psych: appropriate  affect   Diagnostic Studies EKG NSR    Assessment and Plan  1. Palpitations - fairly significant caffeine intake, counseled to decrease daily consumption - educated on vagal maneuvers - will obtain 7 day event monitor to further evaluate potenital arrhythmia - given her reported increasing DOE, orthopnea will obtain echo to evaluate for structural heart disease - request recent labs from pcp, order TSH if has not had done.   F/u 2 months      Antoine Poche, M.D., F.A.C.C.

## 2013-12-19 NOTE — Patient Instructions (Signed)
Your physician has requested that you have an echocardiogram. Echocardiography is a painless test that uses sound waves to create images of your heart. It provides your doctor with information about the size and shape of your heart and how well your heart's chambers and valves are working. This procedure takes approximately one hour. There are no restrictions for this procedure. Your physician has recommended that you wear a 7 day event monitor. Event monitors are medical devices that record the heart's electrical activity. Doctors most often us these monitors to diagnose arrhythmias. Arrhythmias are problems with the speed or rhythm of the heartbeat. The monitor is a small, portable device. You can wear one while you do your normal daily activities. This is usually used to diagnose what is causing palpitations/syncope (passing out). Office will contact with results via phone or letter.   Continue all current medications. Follow up in  2 months

## 2013-12-21 ENCOUNTER — Encounter: Payer: Self-pay | Admitting: Cardiology

## 2013-12-22 ENCOUNTER — Telehealth: Payer: Self-pay | Admitting: *Deleted

## 2013-12-22 ENCOUNTER — Ambulatory Visit (HOSPITAL_COMMUNITY): Admission: RE | Admit: 2013-12-22 | Payer: BC Managed Care – HMO | Source: Ambulatory Visit

## 2013-12-22 NOTE — Telephone Encounter (Signed)
Pt is calling about 7 day heart monitor, ordered by Dr Wyline MoodBranch on 12/19/13 at the [place] office. Can we look and see if one has been mailed to her? If not can we get this taken care of for them and the patient?

## 2013-12-22 NOTE — Telephone Encounter (Signed)
Called [employer]. Pt was never enrolled through [employer]. Will enroll pt, so pt can get monitor sent to home.

## 2013-12-27 DIAGNOSIS — R002 Palpitations: Secondary | ICD-10-CM

## 2014-01-01 ENCOUNTER — Telehealth: Payer: Self-pay | Admitting: Cardiology

## 2014-01-01 NOTE — Telephone Encounter (Signed)
Called Mrs. Dockery in regards to trying to re-schedule her echo.  Left a message.

## 2014-01-02 ENCOUNTER — Telehealth: Payer: Self-pay | Admitting: Cardiology

## 2014-01-02 NOTE — Telephone Encounter (Signed)
Left message on pt's cell/work phone [phone].  [employer] [place] on file termed 10-29-13.  Pt has 2d echo scheduled at [facility] 01-05-14. Will need new insurance info to precertify echo or if pt will be self pay.

## 2014-01-03 ENCOUNTER — Other Ambulatory Visit: Payer: Self-pay | Admitting: *Deleted

## 2014-01-03 DIAGNOSIS — R002 Palpitations: Secondary | ICD-10-CM

## 2014-01-04 NOTE — Telephone Encounter (Signed)
01-02-14 LM on pt cell#[phone] need insurance info for prior auth for 01-05-14 echo.  01-04-14. Lm again on pt cell#.  LM for pt's contacts: Sarah Collins [phone] and Sarah Collins [phone] to contact us.

## 2014-01-05 ENCOUNTER — Ambulatory Visit (HOSPITAL_COMMUNITY): Payer: BC Managed Care – HMO | Attending: Cardiology

## 2014-01-05 ENCOUNTER — Telehealth: Payer: Self-pay | Admitting: Cardiology

## 2014-01-05 NOTE — Telephone Encounter (Signed)
Pt returned my call by voice mail 01-04-14.  I left voice mail at cell# she left on my VM explaining we need her insurance info for her 3pm echo today.  Also, when she checks in and she hasn't notified what insurance she has for [place] office to contact me in order to check for any prior auth requirements for her echo.

## 2014-02-16 ENCOUNTER — Ambulatory Visit: Payer: BC Managed Care – HMO | Admitting: Cardiology

## 2014-03-06 ENCOUNTER — Ambulatory Visit: Payer: BC Managed Care – HMO | Admitting: Cardiology

## 2014-04-02 ENCOUNTER — Encounter: Payer: Self-pay | Admitting: Cardiology

## 2014-12-02 IMAGING — CT CT ABD-PELV W/ CM
2 of 3 series · 16 of 46 positions shown, 18 images · IV contrast (Omnipaque 300)
Comparison: 12/30/2011

CLINICAL DATA: Abdominal pain, nausea.

CT ABDOMEN AND PELVIS WITH CONTRAST
TECHNIQUE: Multidetector CT imaging of the abdomen and pelvis was
performed following the standard protocol during bolus
administration of intravenous contrast.
Contrast: 100mL OMNIPAQUE IOHEXOL 300 MG/ML  SOLN

[Series 2: abd_pel_with 5.0 b40f · axial · 0.72mm/px · z∈[-444,-4]mm · 13 of 102 slices shown, 15 images]
[im 7/102  soft-tissue]
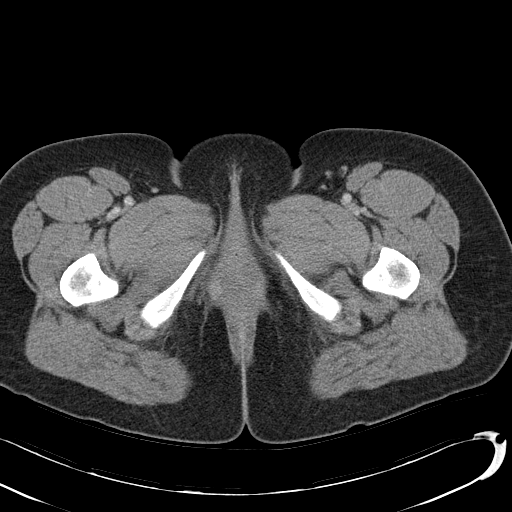
[im 7/102  bone]
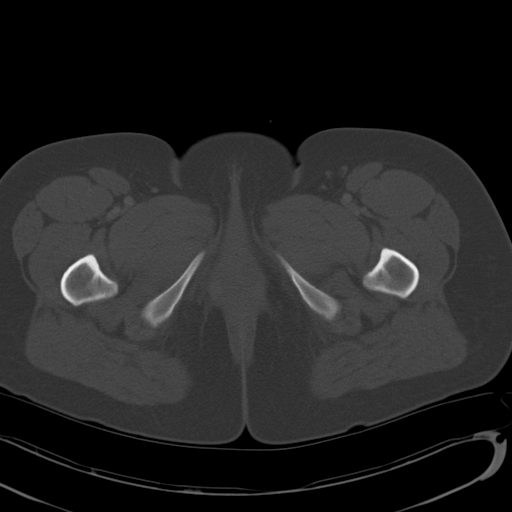
[im 14/102  soft-tissue]
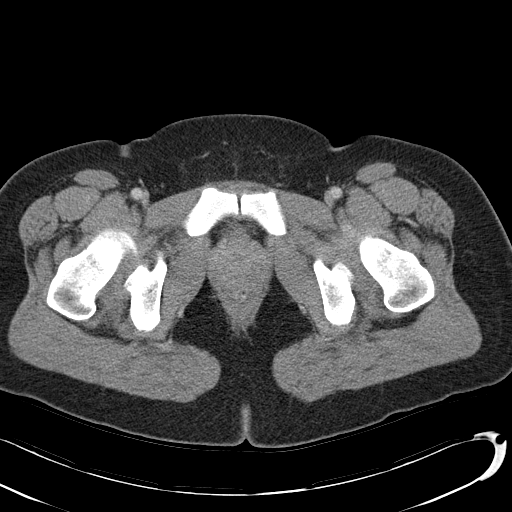
[im 20/102  soft-tissue]
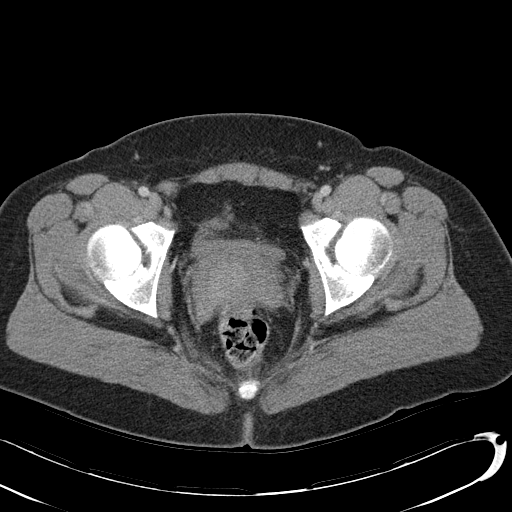
[im 30/102  soft-tissue]
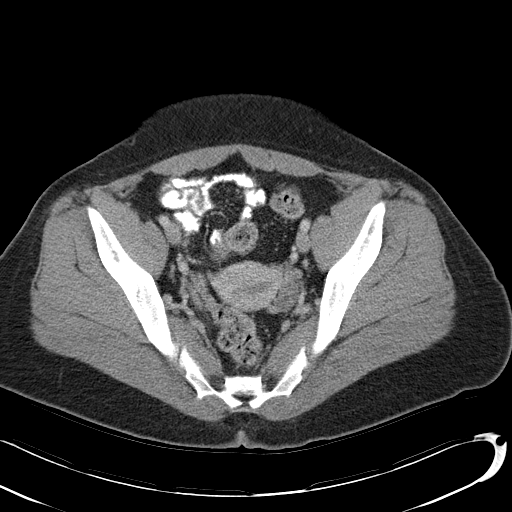
[im 36/102  soft-tissue]
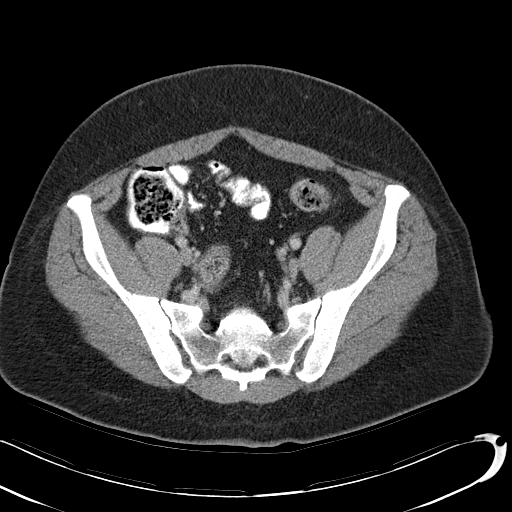
[im 43/102  soft-tissue]
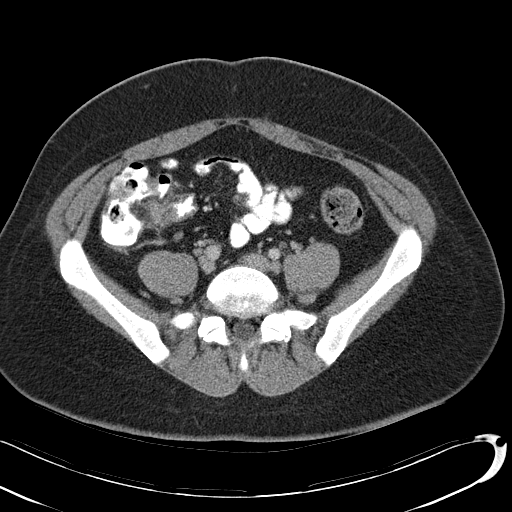
[im 53/102  soft-tissue]
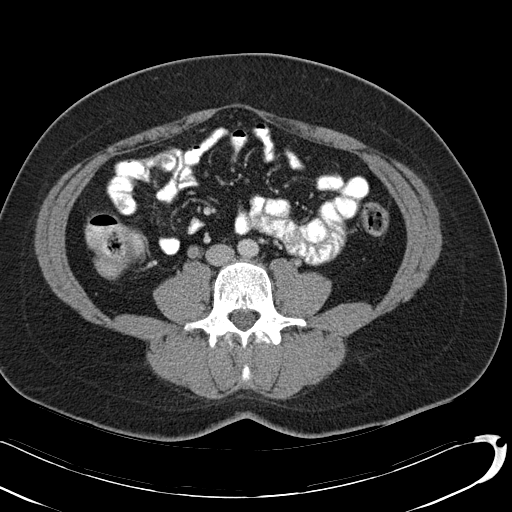
[im 59/102  soft-tissue]
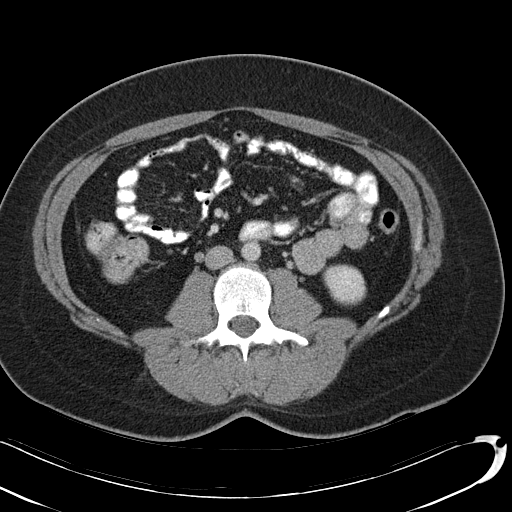
[im 66/102  soft-tissue]
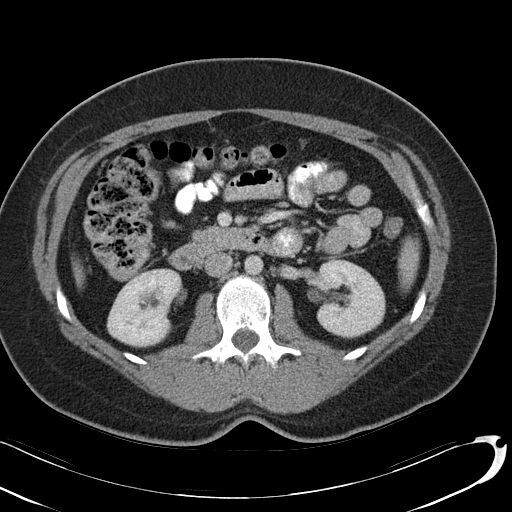
[im 66/102  bone]
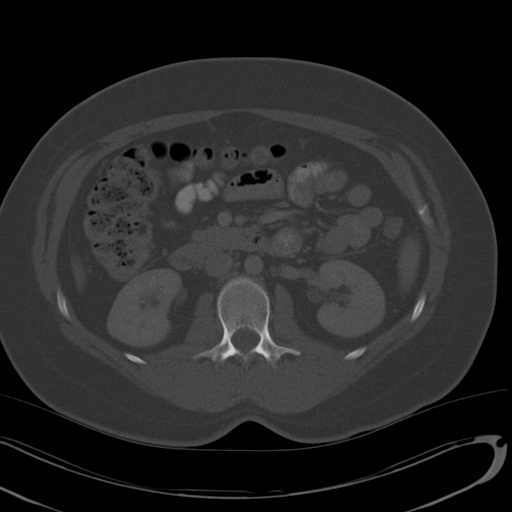
[im 72/102  soft-tissue]
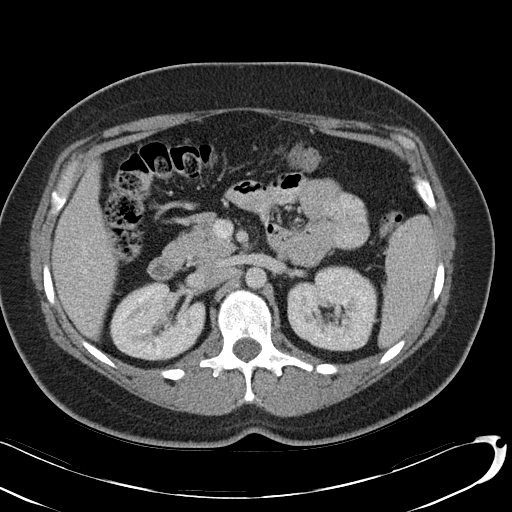
[im 82/102  soft-tissue]
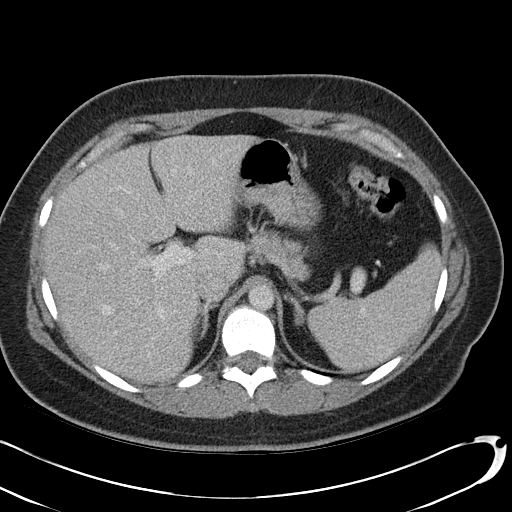
[im 88/102  soft-tissue]
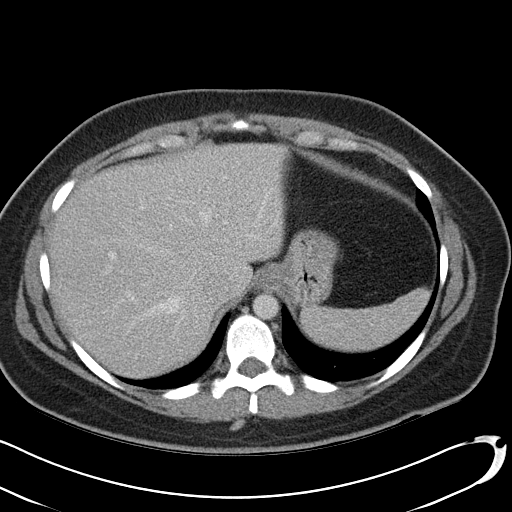
[im 95/102  soft-tissue]
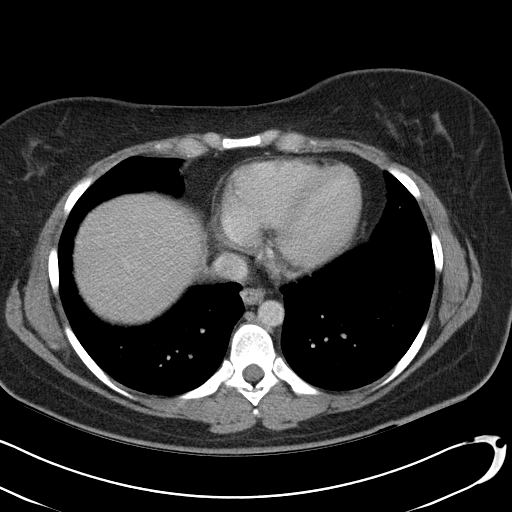

[Series 4: abd_pel_with 3.0 spo cor · coronal · 0.74mm/px · 3 of 78 slices shown]
[im 26/78  soft-tissue]
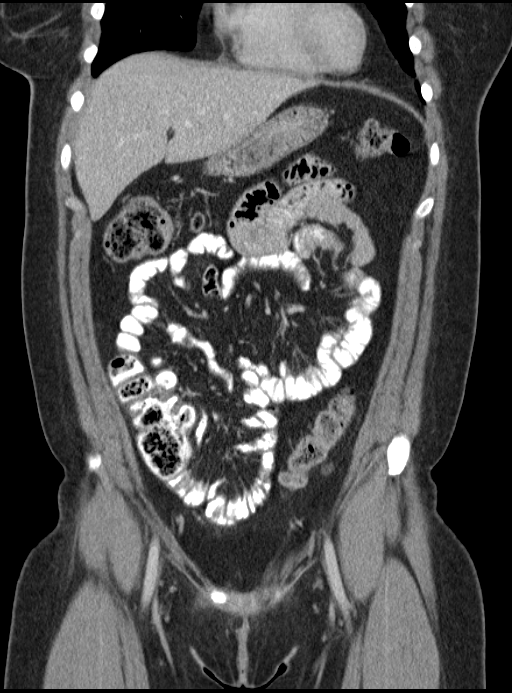
[im 35/78  soft-tissue]
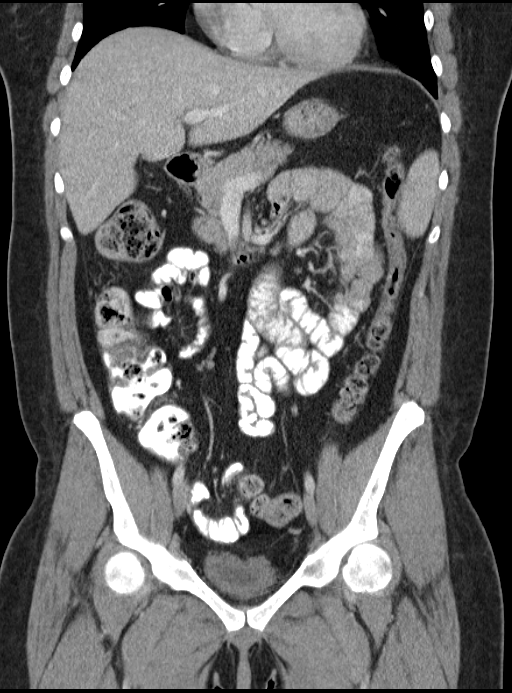
[im 43/78  soft-tissue]
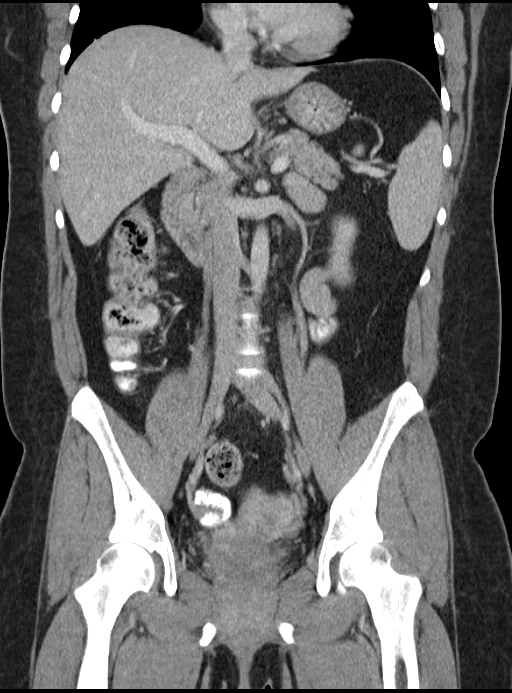

[16 of 46 positions shown; findings below may reference images not displayed]

FINDINGS: Lung bases are clear.  No effusions.  Heart is normal
size.

Liver is unremarkable except for a an area of focal fatty
infiltration adjacent to the falciform ligament.  Prior
cholecystectomy.  Stomach, spleen, pancreas, adrenals and left
kidney are normal.  Small cyst in the mid pole of the right kidney.
No hydronephrosis.

Appendix is visualized and is normal.  Previously seen inflammation
anterior to the sigmoid colon has resolved.  No current
inflammatory process in the abdomen or pelvis.  Large and small
bowel are grossly unremarkable.  Uterus, ovaries and urinary
bladder are grossly unremarkable. Small amount of free fluid in the
pelvis, likely physiologic.  Previously seen left ovarian cyst has
resolved.

No acute bony abnormality.
IMPRESSION: No acute findings in the abdomen or pelvis.

## 2018-01-11 ENCOUNTER — Emergency Department (HOSPITAL_COMMUNITY): Payer: BLUE CROSS/BLUE SHIELD

## 2018-01-11 ENCOUNTER — Encounter (HOSPITAL_COMMUNITY): Payer: Self-pay | Admitting: *Deleted

## 2018-01-11 ENCOUNTER — Emergency Department (HOSPITAL_COMMUNITY)
Admission: EM | Admit: 2018-01-11 | Discharge: 2018-01-11 | Disposition: A | Discharge status: Home / Self Care | Payer: BLUE CROSS/BLUE SHIELD | Attending: Emergency Medicine | Admitting: Emergency Medicine

## 2018-01-11 ENCOUNTER — Other Ambulatory Visit: Payer: Self-pay

## 2018-01-11 DIAGNOSIS — Z79899 Other long term (current) drug therapy: Secondary | ICD-10-CM | POA: Insufficient documentation

## 2018-01-11 DIAGNOSIS — M79671 Pain in right foot: Secondary | ICD-10-CM | POA: Diagnosis not present

## 2018-01-11 DIAGNOSIS — Y929 Unspecified place or not applicable: Secondary | ICD-10-CM | POA: Insufficient documentation

## 2018-01-11 DIAGNOSIS — Y939 Activity, unspecified: Secondary | ICD-10-CM | POA: Diagnosis not present

## 2018-01-11 DIAGNOSIS — Y998 Other external cause status: Secondary | ICD-10-CM | POA: Insufficient documentation

## 2018-01-11 DIAGNOSIS — S9031XA Contusion of right foot, initial encounter: Secondary | ICD-10-CM | POA: Insufficient documentation

## 2018-01-11 DIAGNOSIS — R2241 Localized swelling, mass and lump, right lower limb: Secondary | ICD-10-CM | POA: Diagnosis not present

## 2018-01-11 DIAGNOSIS — X58XXXA Exposure to other specified factors, initial encounter: Secondary | ICD-10-CM | POA: Diagnosis not present

## 2018-01-11 DIAGNOSIS — S99921A Unspecified injury of right foot, initial encounter: Secondary | ICD-10-CM | POA: Diagnosis not present

## 2018-01-11 MED ORDER — TRAMADOL HCL 50 MG PO TABS: 50.0000 mg | ORAL_TABLET | Freq: Four times a day (QID) | ORAL | 0 refills | Status: DC | PRN

## 2018-01-11 MED ORDER — IBUPROFEN 800 MG PO TABS
800.0000 mg | ORAL_TABLET | Freq: Once | ORAL | Status: AC
  Administered 2018-01-11: 800 mg via ORAL
  Filled 2018-01-11: qty 1

## 2018-01-11 NOTE — Discharge Instructions (Signed)
Your vital signs are within normal limits with the exception of your blood pressure being slightly elevated at 156/93.  The x-ray of your foot is negative for fracture, dislocation, or foreign body.  The examination suggest a contusion to the foot with bruising.  Please use your ice pack over the next couple of days.  Please keep your foot elevated above your waist when you are sitting and above your heart when you are lying down.  Use 600 mg of ibuprofen with breakfast, lunch, dinner, and at bedtime to help with both swelling and with discomfort.  Use the postoperative shoe until you can safely wear your regular shoes.  Use your crutches until you can safely put weight on the right lower extremity.  Please see Dr. Marylene LandStover for podiatry evaluation if this is not improving.

## 2018-01-11 NOTE — ED Triage Notes (Signed)
Pt c/o right foot pain and swelling that started last night. Pt denies injury. Pt reports last night her foot felt sore but it wasn't that bad. This morning pt was able to go to work but by lunch time she was unable to walk on it and she noticed bruising to inner foot.

## 2018-01-11 NOTE — ED Provider Notes (Signed)
[facility] EMERGENCY DEPARTMENT Provider Note   CSN: [id number] Arrival date & time: 01/11/18  1833     History   Chief Complaint Chief Complaint  Patient presents with  . Foot Pain    HPI Ronny FlurryWhitney B Kwasnik is a 32 y.o. female.  Patient is a 32 year old female who presents to the emergency department with a complaint of right foot pain.  The patient states that this problem started on last night, August 12.  She says she noted a small area of soreness there at the bottom of the right foot.  Today she noted more swelling and bruising.  She now has pain with attempting to walk or put any weight on it.  She has pain from the instep of his headache toward the area between the third and fourth toe.  She does not recall stepping on anything.  She does not recall any puncture wounds.  She says she is not jumped from any heights.  She has not been doing any marching.  She cannot pinpoint any activity that should have contributed to this particular problem.  She does work as a Engineer, civil (consulting)nurse, and is on her feet a lot.  She says however she uses nursing shoes and wears good protective footwear.  She presents now for assistance with this issue.  The history is provided by the patient.  Foot Pain  Pertinent negatives include no chest pain, no abdominal pain and no shortness of breath.    Past Medical History:  Diagnosis Date  . Diverticulitis   . IBS (irritable bowel syndrome)   . Migraine headache   . Ovarian cyst    left ovary    Patient Active Problem List   Diagnosis Date Noted  . Migraine without aura, with intractable migraine, so stated, without mention of status migrainosus 05/11/2013  . Cervical radiculitis 05/11/2013  . Right arm numbness 05/11/2013  . Diarrhea 08/19/2012  . Abdominal pain, other specified site 08/19/2012  . Nausea alone 08/19/2012  . GERD (gastroesophageal reflux disease) 08/19/2012    Past Surgical History:  Procedure Laterality Date  . CHOLECYSTECTOMY     age 32  . EXCISIONAL HEMORRHOIDECTOMY    . TONSILLECTOMY    . vaginal hematoma repair       OB History    Gravida  1   Para  1   Term  1   Preterm      AB      Living        SAB      TAB      Ectopic      Multiple      Live Births               Home Medications    Prior to Admission medications   Medication Sig Start Date End Date Taking? Authorizing Provider  citalopram (CELEXA) 40 MG tablet Take 40 mg by mouth at bedtime.    [provider]  eletriptan (RELPAX) 40 MG tablet Take 1 tablet (40 mg total) by mouth as needed for migraine or headache. One tablet by mouth at onset of headache. May repeat in 2 hours if headache persists or recurs. 05/11/13   Micki RileySethi, Pramod S, MD    Family History Family History  Problem Relation Age of Onset  . Colon cancer Neg Hx     Social History Social History   Tobacco Use  . Smoking status: Never Smoker  . Smokeless tobacco: Never Used  Substance Use Topics  .  Alcohol use: No  . Drug use: No     Allergies   Codeine; Morphine and related; and Zofran [ondansetron]   Review of Systems Review of Systems  Constitutional: Negative for activity change.       All ROS Neg except as noted in HPI  HENT: Negative for nosebleeds.   Eyes: Negative for photophobia and discharge.  Respiratory: Negative for cough, shortness of breath and wheezing.   Cardiovascular: Negative for chest pain and palpitations.  Gastrointestinal: Negative for abdominal pain and blood in stool.  Genitourinary: Negative for dysuria, frequency and hematuria.  Musculoskeletal: Negative for arthralgias, back pain and neck pain.       Foot pain  Skin: Negative.   Neurological: Negative for dizziness, seizures and speech difficulty.  Psychiatric/Behavioral: Negative for confusion and hallucinations.     Physical Exam Updated Vital Signs BP (!) 156/93 (BP Location: Right Arm)   Pulse 98   Temp 98.2 F (36.8 C) (Oral)   Resp 13   Ht  5\' 5"  (1.651 m)   Wt 77.1 kg   LMP 01/02/2018   SpO2 100%   BMI 28.29 kg/m   Physical Exam  Constitutional: She is oriented to person, place, and time. She appears well-developed and well-nourished.  Non-toxic appearance.  HENT:  Head: Normocephalic.  Right Ear: Tympanic membrane and external ear normal.  Left Ear: Tympanic membrane and external ear normal.  Eyes: Pupils are equal, round, and reactive to light. EOM and lids are normal.  Neck: Normal range of motion. Neck supple. Carotid bruit is not present.  Cardiovascular: Normal rate, regular rhythm, normal heart sounds, intact distal pulses and normal pulses.  Pulmonary/Chest: Breath sounds normal. No respiratory distress.  Abdominal: Soft. Bowel sounds are normal. There is no tenderness. There is no guarding.  Musculoskeletal: Normal range of motion.       Right foot: There is tenderness.       Feet:  Lymphadenopathy:       Head (right side): No submandibular adenopathy present.       Head (left side): No submandibular adenopathy present.    She has no cervical adenopathy.  Neurological: She is alert and oriented to person, place, and time. She has normal strength. No cranial nerve deficit or sensory deficit.  Skin: Skin is warm and dry.  Psychiatric: She has a normal mood and affect. Her speech is normal.  Nursing note and vitals reviewed.    ED Treatments / Results  Labs (all labs ordered are listed, but only abnormal results are displayed) Labs Reviewed - No data to display  EKG None  Radiology No results found.  Procedures Procedures (including critical care time)  Medications Ordered in ED Medications  ibuprofen (ADVIL,MOTRIN) tablet 800 mg (has no administration in time range)     Initial Impression / Assessment and Plan / ED Course  I have reviewed the triage vital signs and the nursing notes.  Pertinent labs & imaging results that were available during my care of the patient were reviewed by me and  considered in my medical decision making (see chart for details).       Final Clinical Impressions(s) / ED Diagnoses MDM  Blood pressure is elevated at 156/93, otherwise the vital signs within normal limits.  No neurologic or vascular deficits appreciated.  Patient has a very tender slightly raised bruised area at the instep of the right foot extending to the area between the third and fourth toe at the plantar surface.  We will  obtain x-ray to rule out occult fracture, and or foreign body.  X-ray of the left foot is negative for fracture, dislocation, or foreign body.  No examination changes noted.  The patient has been advised to use an ice pack.  To keep the foot elevated above the waist when sitting and above the heart when lying down.  Patient is placed in a postoperative shoe.  She is asked to use crutches until she can safely apply weight to the extremity.  Patient will use 600 mg of ibuprofen with breakfast, lunch, dinner, and at bedtime for pain and swelling.  The patient will use Ultram for more severe pain if needed.  The patient will follow-up with Dr. Marylene Land for podiatry evaluation if not improving.   Final diagnoses:  Contusion of right foot, initial encounter    ED Discharge Orders         Ordered    traMADol (ULTRAM) 50 MG tablet  Every 6 hours PRN     01/11/18 2108           Ivery Quale, PA-C 01/11/18 2112    Loren Racer, MD 01/14/18 [phone]

## 2018-01-13 DIAGNOSIS — S93601A Unspecified sprain of right foot, initial encounter: Secondary | ICD-10-CM | POA: Diagnosis not present

## 2018-01-24 DIAGNOSIS — S93601D Unspecified sprain of right foot, subsequent encounter: Secondary | ICD-10-CM | POA: Diagnosis not present

## 2018-01-27 DIAGNOSIS — M79671 Pain in right foot: Secondary | ICD-10-CM | POA: Diagnosis not present

## 2018-02-03 DIAGNOSIS — M2580 Other specified joint disorders, unspecified joint: Secondary | ICD-10-CM | POA: Diagnosis not present

## 2018-02-10 DIAGNOSIS — M79671 Pain in right foot: Secondary | ICD-10-CM | POA: Diagnosis not present

## 2018-02-24 DIAGNOSIS — M79671 Pain in right foot: Secondary | ICD-10-CM | POA: Diagnosis not present

## 2018-06-22 DIAGNOSIS — J111 Influenza due to unidentified influenza virus with other respiratory manifestations: Secondary | ICD-10-CM | POA: Diagnosis not present

## 2018-06-22 DIAGNOSIS — H6693 Otitis media, unspecified, bilateral: Secondary | ICD-10-CM | POA: Diagnosis not present

## 2018-10-11 DIAGNOSIS — Z1159 Encounter for screening for other viral diseases: Secondary | ICD-10-CM | POA: Diagnosis not present

## 2018-10-11 DIAGNOSIS — J029 Acute pharyngitis, unspecified: Secondary | ICD-10-CM | POA: Diagnosis not present

## 2018-10-11 DIAGNOSIS — B349 Viral infection, unspecified: Secondary | ICD-10-CM | POA: Diagnosis not present

## 2018-10-11 DIAGNOSIS — Z20828 Contact with and (suspected) exposure to other viral communicable diseases: Secondary | ICD-10-CM | POA: Diagnosis not present

## 2018-10-13 ENCOUNTER — Other Ambulatory Visit: Payer: Self-pay

## 2018-10-13 ENCOUNTER — Emergency Department (HOSPITAL_COMMUNITY): Payer: BLUE CROSS/BLUE SHIELD

## 2018-10-13 ENCOUNTER — Encounter (HOSPITAL_COMMUNITY): Payer: Self-pay | Admitting: *Deleted

## 2018-10-13 ENCOUNTER — Emergency Department (HOSPITAL_COMMUNITY)
Admission: EM | Admit: 2018-10-13 | Discharge: 2018-10-13 | Disposition: A | Discharge status: Home / Self Care | Payer: BLUE CROSS/BLUE SHIELD | Attending: Emergency Medicine | Admitting: Emergency Medicine

## 2018-10-13 DIAGNOSIS — R110 Nausea: Secondary | ICD-10-CM | POA: Diagnosis not present

## 2018-10-13 DIAGNOSIS — Z79899 Other long term (current) drug therapy: Secondary | ICD-10-CM | POA: Diagnosis not present

## 2018-10-13 DIAGNOSIS — R509 Fever, unspecified: Secondary | ICD-10-CM | POA: Diagnosis not present

## 2018-10-13 DIAGNOSIS — R51 Headache: Secondary | ICD-10-CM | POA: Insufficient documentation

## 2018-10-13 DIAGNOSIS — Z20828 Contact with and (suspected) exposure to other viral communicable diseases: Secondary | ICD-10-CM | POA: Insufficient documentation

## 2018-10-13 DIAGNOSIS — R0602 Shortness of breath: Secondary | ICD-10-CM | POA: Diagnosis not present

## 2018-10-13 DIAGNOSIS — R05 Cough: Secondary | ICD-10-CM | POA: Insufficient documentation

## 2018-10-13 DIAGNOSIS — Z209 Contact with and (suspected) exposure to unspecified communicable disease: Secondary | ICD-10-CM | POA: Diagnosis not present

## 2018-10-13 DIAGNOSIS — J80 Acute respiratory distress syndrome: Secondary | ICD-10-CM | POA: Diagnosis not present

## 2018-10-13 DIAGNOSIS — R069 Unspecified abnormalities of breathing: Secondary | ICD-10-CM | POA: Diagnosis not present

## 2018-10-13 LAB — CBC WITH DIFFERENTIAL/PLATELET
Abs Immature Granulocytes: 0.03 10*3/uL (ref 0.00–0.07)
Basophils Absolute: 0.1 10*3/uL (ref 0.0–0.1)
Basophils Relative: 1 %
Eosinophils Absolute: 0.1 10*3/uL (ref 0.0–0.5)
Eosinophils Relative: 1 %
HCT: 42.9 % (ref 36.0–46.0)
Hemoglobin: 14.8 g/dL (ref 12.0–15.0)
Immature Granulocytes: 0 %
Lymphocytes Relative: 24 %
Lymphs Abs: 2.2 10*3/uL (ref 0.7–4.0)
MCH: 31.6 pg (ref 26.0–34.0)
MCHC: 34.5 g/dL (ref 30.0–36.0)
MCV: 91.5 fL (ref 80.0–100.0)
Monocytes Absolute: 0.4 10*3/uL (ref 0.1–1.0)
Monocytes Relative: 5 %
Neutro Abs: 6.6 10*3/uL (ref 1.7–7.7)
Neutrophils Relative %: 69 %
Platelets: 377 10*3/uL (ref 150–400)
RBC: 4.69 MIL/uL (ref 3.87–5.11)
RDW: 12.1 % (ref 11.5–15.5)
WBC: 9.4 10*3/uL (ref 4.0–10.5)
nRBC: 0 % (ref 0.0–0.2)

## 2018-10-13 LAB — BASIC METABOLIC PANEL
Anion gap: 12 (ref 5–15)
BUN: 17 mg/dL (ref 6–20)
CO2: 19 mmol/L — ABNORMAL LOW (ref 22–32)
Calcium: 9.7 mg/dL (ref 8.9–10.3)
Chloride: 106 mmol/L (ref 98–111)
Creatinine, Ser: 0.61 mg/dL (ref 0.44–1.00)
GFR calc Af Amer: >60 mL/min (ref >60)
GFR calc non Af Amer: >60 mL/min (ref >60)
Glucose, Bld: 94 mg/dL (ref 70–99)
Potassium: 3.5 mmol/L (ref 3.5–5.1)
Sodium: 137 mmol/L (ref 135–145)

## 2018-10-13 LAB — SARS CORONAVIRUS 2 BY RT PCR (HOSPITAL ORDER, PERFORMED IN ~~LOC~~ HOSPITAL LAB): SARS Coronavirus 2: NEGATIVE

## 2018-10-13 MED ORDER — ALBUTEROL SULFATE HFA 108 (90 BASE) MCG/ACT IN AERS
2.0000 | INHALATION_SPRAY | Freq: Once | RESPIRATORY_TRACT | Status: AC
  Administered 2018-10-13: 2 via RESPIRATORY_TRACT
  Filled 2018-10-13: qty 6.7

## 2018-10-13 MED ORDER — LORAZEPAM 1 MG PO TABS
1.0000 mg | ORAL_TABLET | Freq: Once | ORAL | Status: AC
  Administered 2018-10-13: 1 mg via ORAL
  Filled 2018-10-13: qty 1

## 2018-10-13 NOTE — ED Triage Notes (Signed)
Pt c/o increased SOB, fever, cough, nausea/diarrhea, HA, and body aches x 2 days. 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 Last worked on Friday. Pt had Covid test two days ago at [facility] in [place], but no result yet.

## 2018-10-13 NOTE — ED Provider Notes (Addendum)
[facility] EMERGENCY DEPARTMENT Provider Note   CSN: [id number] Arrival date & time: 10/13/18  1203    History   Chief Complaint Chief Complaint  Patient presents with  . Shortness of Breath    HPI Sarah Collins is a 33 y.o. female.     Patient brought in by EMS.  Patient with complaint of shortness of breath fever cough nausea and some diarrhea headache and body aches for the past 2 days.  Symptoms started on Tuesday.  Patient was seen at the urgent care [place] 2 days ago had COVID test done.  But the results are not back yet.  Patient feels as if she is having increasing worsening shortness of breath here today.  Patient on arrival here without a fever.  Not tachycardic not hypotensive.  Initial respiratory rate was 22 but then it settled down to 14.  Oxygen saturations have been in the upper 90s pretty much 99% to 100%.  Patient just feels as if she cannot get any breath.  But she does come across as nontoxic.  Seems very anxious.  Patient denied any leg swelling.  No history of any breathing problems in the past.  No history of asthma.     Past Medical History:  Diagnosis Date  . Diverticulitis   . IBS (irritable bowel syndrome)   . Migraine headache   . Ovarian cyst    left ovary    Patient Active Problem List   Diagnosis Date Noted  . Migraine without aura, with intractable migraine, so stated, without mention of status migrainosus 05/11/2013  . Cervical radiculitis 05/11/2013  . Right arm numbness 05/11/2013  . Diarrhea 08/19/2012  . Abdominal pain, other specified site 08/19/2012  . Nausea alone 08/19/2012  . GERD (gastroesophageal reflux disease) 08/19/2012    Past Surgical History:  Procedure Laterality Date  . CHOLECYSTECTOMY     age 33  . EXCISIONAL HEMORRHOIDECTOMY    . TONSILLECTOMY    . vaginal hematoma repair       OB History    Gravida  1   Para  1   Term  1   Preterm      AB      Living        SAB      TAB      Ectopic      Multiple      Live Births               Home Medications    Prior to Admission medications   Medication Sig Start Date End Date Taking? Authorizing Provider  citalopram (CELEXA) 40 MG tablet Take 40 mg by mouth at bedtime.    [provider]  eletriptan (RELPAX) 40 MG tablet Take 1 tablet (40 mg total) by mouth as needed for migraine or headache. One tablet by mouth at onset of headache. May repeat in 2 hours if headache persists or recurs. 05/11/13   Micki RileySethi, Pramod S, MD  traMADol (ULTRAM) 50 MG tablet Take 1 tablet (50 mg total) by mouth every 6 (six) hours as needed. 01/11/18   Ivery QualeBryant, Hobson, PA-C    Family History Family History  Problem Relation Age of Onset  . Colon cancer Neg Hx     Social History Social History   Tobacco Use  . Smoking status: Never Smoker  . Smokeless tobacco: Never Used  Substance Use Topics  . Alcohol use: No  . Drug use: No     Allergies  Codeine; Morphine and related; and Zofran [ondansetron]   Review of Systems Review of Systems  Constitutional: Positive for fever. Negative for chills.  HENT: Negative for congestion, rhinorrhea and sore throat.   Eyes: Negative for redness and visual disturbance.  Respiratory: Positive for shortness of breath. Negative for cough and wheezing.   Cardiovascular: Negative for chest pain and leg swelling.  Gastrointestinal: Positive for diarrhea and nausea. Negative for abdominal pain and vomiting.  Genitourinary: Negative for dysuria.  Musculoskeletal: Positive for myalgias. Negative for back pain and neck pain.  Skin: Negative for rash.  Neurological: Positive for headaches. Negative for dizziness and light-headedness.  Hematological: Does not bruise/bleed easily.  Psychiatric/Behavioral: Negative for confusion.     Physical Exam Updated Vital Signs BP (!) 136/97   Pulse 92   Temp 98.8 F (37.1 C) (Oral)   Resp 14   LMP 09/13/2018 (Approximate)   SpO2 100%    Physical Exam Vitals signs and nursing note reviewed.  Constitutional:      General: She is not in acute distress.    Appearance: She is well-developed.  HENT:     Head: Normocephalic and atraumatic.  Eyes:     Extraocular Movements: Extraocular movements intact.     Conjunctiva/sclera: Conjunctivae normal.     Pupils: Pupils are equal, round, and reactive to light.  Neck:     Musculoskeletal: Normal range of motion and neck supple.  Cardiovascular:     Rate and Rhythm: Normal rate and regular rhythm.     Heart sounds: No murmur.  Pulmonary:     Effort: Pulmonary effort is normal. No respiratory distress.     Breath sounds: Normal breath sounds. No stridor. No wheezing, rhonchi or rales.  Abdominal:     Palpations: Abdomen is soft.     Tenderness: There is no abdominal tenderness.  Musculoskeletal: Normal range of motion.        General: No swelling.  Skin:    General: Skin is warm and dry.  Neurological:     General: No focal deficit present.     Mental Status: She is alert and oriented to person, place, and time.      ED Treatments / Results  Labs (all labs ordered are listed, but only abnormal results are displayed) Labs Reviewed  BASIC METABOLIC PANEL - Abnormal; Notable for the following components:      Result Value   CO2 19 (*)    All other components within normal limits  SARS CORONAVIRUS 2 (HOSPITAL ORDER, PERFORMED IN [facility])  CBC WITH DIFFERENTIAL/PLATELET    EKG None  Radiology Dg Chest Port 1 View  Result Date: 10/13/2018 CLINICAL DATA:  Fever and shortness of breath EXAM: PORTABLE CHEST 1 VIEW COMPARISON:  07/28/2010 FINDINGS: Cardiac shadow is within normal limits. The lungs are well aerated bilaterally. No focal infiltrate or sizable effusion is seen. No bony abnormality is noted. IMPRESSION: No active disease. Electronically Signed   By: Alcide Clever M.D.   On: 10/13/2018 12:53    Procedures Procedures (including critical care  time)  Medications Ordered in ED Medications  albuterol (VENTOLIN HFA) 108 (90 Base) MCG/ACT inhaler 2 puff (has no administration in time range)  LORazepam (ATIVAN) tablet 1 mg (has no administration in time range)     Initial Impression / Assessment and Plan / ED Course  I have reviewed the triage vital signs and the nursing notes.  Pertinent labs & imaging results that were available during my care of  the patient were reviewed by me and considered in my medical decision making (see chart for details).       Patient's vital signs very reassuring.  Oxygen saturation 100% not tachycardic.  Respiratory rate not up.  Blood pressure not hypotensive.  No fever here.  Patient symptoms not inconsistent with COVID-19.  But duration of her symptoms of only been 3 days.  Chest x-ray here today negative for any signs of pneumonia or any abnormalities.  Labs without any significant abnormality normal white blood cell count.  Normal electrolytes.  CO2 was 19 but anion gap was normal.  Lungs are clear bilaterally no wheezing.  Patient extremely anxious.  Patient threatened to leave AMA.  I recommended the patient have an albuterol breathing treatment even though there was no wheezing just to see what effect it would have on her.  Also recommended some Ativan orally.  And we will go ahead and send a rapid COVID test because she is so hysterical.  She did agree to stay and will not leave prior to the results of the rapid test.  Will reassess after the breathing treatment and after the Ativan.   Final Clinical Impressions(s) / ED Diagnoses   Final diagnoses:  SOB (shortness of breath)    ED Discharge Orders    None      Addendum: Patient COVID-19 test negative.  Patient did receive Ativan as well as albuterol treatment.  She states that it made much difference.  But patient does appear much calmer.  We will have patient use the albuterol inhaler as needed.  Patient nontoxic oxygen saturations are  100%.  Respiratory rate is around 14.  Patient not tachycardic.  Patient stable for discharge home.  Patient states she did not need a work note.   Patient will return for any new or worse symptoms   Vanetta Mulders, MD 10/13/18 1536    Vanetta Mulders, MD 10/13/18 [phone]

## 2018-10-13 NOTE — ED Notes (Signed)
Pt initially wanted to leave AMA, but after speaking with RN, pt decided to stay and receive breathing tx and results from Covid test.

## 2018-10-13 NOTE — Discharge Instructions (Addendum)
COVID test was negative.  Recommend using the albuterol inhaler as needed.  Follow-up with your doctor.  Return for any new or worse symptoms.  In addition labs and chest x-ray here today were negative.  Oxygen saturation on room air has been in the 99 to 100% range.  Anything 90% or better is normal.

## 2018-10-17 DIAGNOSIS — Z6826 Body mass index (BMI) 26.0-26.9, adult: Secondary | ICD-10-CM | POA: Diagnosis not present

## 2018-10-17 DIAGNOSIS — R509 Fever, unspecified: Secondary | ICD-10-CM | POA: Diagnosis not present

## 2019-03-10 ENCOUNTER — Other Ambulatory Visit: Payer: Self-pay

## 2019-03-10 DIAGNOSIS — Z20822 Contact with and (suspected) exposure to covid-19: Secondary | ICD-10-CM

## 2019-03-10 DIAGNOSIS — Z20828 Contact with and (suspected) exposure to other viral communicable diseases: Secondary | ICD-10-CM | POA: Diagnosis not present

## 2019-03-12 LAB — NOVEL CORONAVIRUS, NAA: SARS-CoV-2, NAA: NOT DETECTED

## 2019-03-31 DIAGNOSIS — Z6827 Body mass index (BMI) 27.0-27.9, adult: Secondary | ICD-10-CM | POA: Diagnosis not present

## 2019-03-31 DIAGNOSIS — I889 Nonspecific lymphadenitis, unspecified: Secondary | ICD-10-CM | POA: Diagnosis not present

## 2019-04-07 DIAGNOSIS — I889 Nonspecific lymphadenitis, unspecified: Secondary | ICD-10-CM | POA: Diagnosis not present

## 2019-04-07 DIAGNOSIS — R221 Localized swelling, mass and lump, neck: Secondary | ICD-10-CM | POA: Diagnosis not present

## 2019-05-30 ENCOUNTER — Ambulatory Visit: Payer: BC Managed Care – PPO | Attending: Internal Medicine

## 2019-05-30 ENCOUNTER — Other Ambulatory Visit: Payer: Self-pay

## 2019-05-30 DIAGNOSIS — Z20822 Contact with and (suspected) exposure to covid-19: Secondary | ICD-10-CM

## 2019-05-30 DIAGNOSIS — Z20828 Contact with and (suspected) exposure to other viral communicable diseases: Secondary | ICD-10-CM | POA: Diagnosis not present

## 2019-05-31 LAB — NOVEL CORONAVIRUS, NAA: SARS-CoV-2, NAA: NOT DETECTED

## 2019-06-06 DIAGNOSIS — F331 Major depressive disorder, recurrent, moderate: Secondary | ICD-10-CM | POA: Diagnosis not present

## 2019-07-10 DIAGNOSIS — F331 Major depressive disorder, recurrent, moderate: Secondary | ICD-10-CM | POA: Diagnosis not present

## 2019-07-19 DIAGNOSIS — S6702XA Crushing injury of left thumb, initial encounter: Secondary | ICD-10-CM | POA: Diagnosis not present

## 2019-12-13 ENCOUNTER — Other Ambulatory Visit: Payer: Self-pay

## 2019-12-13 ENCOUNTER — Ambulatory Visit
Admission: RE | Admit: 2019-12-13 | Discharge: 2019-12-13 | Disposition: A | Discharge status: Home / Self Care | Payer: BC Managed Care – PPO | Source: Ambulatory Visit | Attending: Emergency Medicine | Admitting: Emergency Medicine

## 2019-12-13 VITALS — BP 154/97 | HR 86 | Temp 98.3°F | Resp 19

## 2019-12-13 DIAGNOSIS — R6889 Other general symptoms and signs: Secondary | ICD-10-CM | POA: Insufficient documentation

## 2019-12-13 LAB — POCT RAPID STREP A (OFFICE): Rapid Strep A Screen: NEGATIVE

## 2019-12-13 MED ORDER — ONDANSETRON HCL 4 MG PO TABS
4.0000 mg | ORAL_TABLET | Freq: Four times a day (QID) | ORAL | 0 refills | Status: DC
Start: 2019-12-13 — End: 2020-10-04

## 2019-12-13 MED ORDER — FLUTICASONE PROPIONATE 50 MCG/ACT NA SUSP
2.0000 | Freq: Every day | NASAL | 0 refills | Status: DC
Start: 2019-12-13 — End: 2020-10-04

## 2019-12-13 MED ORDER — CETIRIZINE HCL 10 MG PO TABS
10.0000 mg | ORAL_TABLET | Freq: Every day | ORAL | 0 refills | Status: DC
Start: 2019-12-13 — End: 2023-06-15

## 2019-12-13 MED ORDER — MELOXICAM 15 MG PO TABS
15.0000 mg | ORAL_TABLET | Freq: Every day | ORAL | 0 refills | Status: DC
Start: 2019-12-13 — End: 2020-10-04

## 2019-12-13 NOTE — ED Provider Notes (Signed)
[facility]   [id number] 12/13/19 Arrival Time: 0957   CC: flu like symptoms  SUBJECTIVE: History from: patient.  KEYAUNA GRAEFE is a 34 y.o. female who presents with body aches, emesis, diarrhea, headache, sore throat, and fever with tmax of 100.3 at home x 2 days.  Husband tested positive for strep.  Has tried OTC medications without relief.  Symptoms are made worse with swallowing, but tolerating liquids and own secretions.  Reports previous symptoms in the past.   Denies sinus pain, rhinorrhea, cough, SOB, wheezing, chest pain, changes in bladder habits.     ROS: As per HPI.  All other pertinent ROS negative.     Past Medical History:  Diagnosis Date  . Diverticulitis   . IBS (irritable bowel syndrome)   . Migraine headache   . Ovarian cyst    left ovary   Past Surgical History:  Procedure Laterality Date  . CHOLECYSTECTOMY     age 34  . EXCISIONAL HEMORRHOIDECTOMY    . TONSILLECTOMY    . vaginal hematoma repair     Allergies  Allergen Reactions  . Codeine Hives  . Morphine And Related Hives  . Zofran [Ondansetron] Hives    Only to IV Zofran, patient can take tablets   No current facility-administered medications on file prior to encounter.   Current Outpatient Medications on File Prior to Encounter  Medication Sig Dispense Refill  . [DISCONTINUED] eletriptan (RELPAX) 40 MG tablet Take 1 tablet (40 mg total) by mouth as needed for migraine or headache. One tablet by mouth at onset of headache. May repeat in 2 hours if headache persists or recurs. (Patient not taking: Reported on 10/13/2018) 10 tablet 0   Social History   Socioeconomic History  . Marital status: Married    Spouse name: Not on file  . Number of children: 1  . Years of education: Charity fundraiser  . Highest education level: Not on file  Occupational History  . Occupation: [employer]    Employer: [employer]    Comment: RN  Tobacco Use  . Smoking status: Never Smoker  . Smokeless  tobacco: Never Used  Vaping Use  . Vaping Use: Never used  Substance and Sexual Activity  . Alcohol use: No  . Drug use: No  . Sexual activity: Not on file  Other Topics Concern  . Not on file  Social History Narrative   Patient lives at home with family.   Caffeine Use: 5-7 sodas daily   Social Determinants of Health   Financial Resource Strain:   . Difficulty of Paying Living Expenses:   Food Insecurity:   . Worried About Programme researcher, broadcasting/film/video in the Last Year:   . Barista in the Last Year:   Transportation Needs:   . Freight forwarder (Medical):   Marland Kitchen Lack of Transportation (Non-Medical):   Physical Activity:   . Days of Exercise per Week:   . Minutes of Exercise per Session:   Stress:   . Feeling of Stress :   Social Connections:   . Frequency of Communication with Friends and Family:   . Frequency of Social Gatherings with Friends and Family:   . Attends Religious Services:   . Active Member of Clubs or Organizations:   . Attends Banker Meetings:   Marland Kitchen Marital Status:   Intimate Partner Violence:   . Fear of Current or Ex-Partner:   . Emotionally Abused:   Marland Kitchen Physically Abused:   .  Sexually Abused:    Family History  Problem Relation Age of Onset  . Healthy Mother   . Healthy Father   . Colon cancer Neg Hx     OBJECTIVE:  Vitals:   12/13/19 1016  BP: (!) 154/97  Pulse: 86  Resp: 19  Temp: 98.3 F (36.8 C)  SpO2: 98%     General appearance: alert; appears fatigued, but nontoxic; speaking in full sentences and tolerating own secretions HEENT: NCAT; Ears: EACs clear, TMs pearly gray; Eyes: PERRL.  EOM grossly intact. Nose: nares patent without rhinorrhea, Throat: oropharynx clear, tonsils non erythematous or enlarged, uvula midline  Neck: supple without LAD Lungs: unlabored respirations, symmetrical air entry; cough: mild; no respiratory distress; CTAB Heart: regular rate and rhythm.   Abdomen: soft, nondistended, normal active  bowel sounds; nontender to palpation; no guarding  Skin: warm and dry Psychological: alert and cooperative; normal mood and affect  LABS:  Results for orders placed or performed during the hospital encounter of 12/13/19 (from the past 24 hour(s))  POCT rapid strep A     Status: None   Collection Time: 12/13/19 10:24 AM  Result Value Ref Range   Rapid Strep A Screen Negative Negative     ASSESSMENT & PLAN:  1. Flu-like symptoms     Meds ordered this encounter  Medications  . meloxicam (MOBIC) 15 MG tablet    Sig: Take 1 tablet (15 mg total) by mouth daily.    Dispense:  30 tablet    Refill:  0    Order Specific Question:   Supervising Provider    Answer:   Eustace Moore [[id number]]  . cetirizine (ZYRTEC) 10 MG tablet    Sig: Take 1 tablet (10 mg total) by mouth daily.    Dispense:  30 tablet    Refill:  0    Order Specific Question:   Supervising Provider    Answer:   Eustace Moore [[id number]]  . fluticasone (FLONASE) 50 MCG/ACT nasal spray    Sig: Place 2 sprays into both nostrils daily.    Dispense:  16 g    Refill:  0    Order Specific Question:   Supervising Provider    Answer:   Eustace Moore [[id number]]  . ondansetron (ZOFRAN) 4 MG tablet    Sig: Take 1 tablet (4 mg total) by mouth every 6 (six) hours.    Dispense:  12 tablet    Refill:  0    Order Specific Question:   Supervising Provider    Answer:   Eustace Moore [[id number]]    Strep negative.  Culture sent.  We will call you with abnormal results Declines COVID test at this time Get plenty of rest and push fluids zyrtec for nasal congestion, runny nose, and/or sore throat flonase for nasal congestion and runny nose Zofran for nausea and vomiting Mobic for body aches and chills Use medications daily for symptom relief Follow up with PCP Call or go to the ED if you have any new or worsening symptoms such as fever, worsening cough, shortness of breath, chest tightness, chest pain, turning blue,  changes in mental status, etc...   Reviewed expectations re: course of current medical issues. Questions answered. Outlined signs and symptoms indicating need for more acute intervention. Patient verbalized understanding. After Visit Summary given.         Rennis Harding, PA-C 12/13/19 1040

## 2019-12-13 NOTE — Discharge Instructions (Signed)
Strep negative.  Culture sent.  We will call you with abnormal results Declines COVID test at this time Get plenty of rest and push fluids zyrtec for nasal congestion, runny nose, and/or sore throat flonase for nasal congestion and runny nose Zofran for nausea and vomiting Mobic for body aches and chills Use medications daily for symptom relief Follow up with PCP Call or go to the ED if you have any new or worsening symptoms such as fever, worsening cough, shortness of breath, chest tightness, chest pain, turning blue, changes in mental status, etc..Marland Kitchen

## 2019-12-13 NOTE — ED Triage Notes (Addendum)
Pt presents with complaints of body aches, emesis, diarrhea, headache, sore throat, and fever 100.3 at home. Pts husband tested positive for strep last week. Pt reports decreased appetite. Reports taking tylenol at home with no relief. Pt is not concerned for covid. Reports having both vaccines.

## 2019-12-16 LAB — CULTURE, GROUP A STREP (THRC)

## 2020-04-30 ENCOUNTER — Other Ambulatory Visit: Payer: BC Managed Care – PPO

## 2020-04-30 ENCOUNTER — Other Ambulatory Visit: Payer: Self-pay

## 2020-04-30 DIAGNOSIS — Z20822 Contact with and (suspected) exposure to covid-19: Secondary | ICD-10-CM | POA: Diagnosis not present

## 2020-05-01 LAB — SPECIMEN STATUS REPORT

## 2020-05-01 LAB — SARS-COV-2, NAA 2 DAY TAT

## 2020-05-01 LAB — NOVEL CORONAVIRUS, NAA: SARS-CoV-2, NAA: DETECTED — AB

## 2020-05-02 ENCOUNTER — Telehealth: Payer: Self-pay | Admitting: Nurse Practitioner

## 2020-05-02 ENCOUNTER — Encounter: Payer: Self-pay | Admitting: Nurse Practitioner

## 2020-05-02 DIAGNOSIS — U071 COVID-19: Secondary | ICD-10-CM

## 2020-05-02 NOTE — Telephone Encounter (Signed)
Called to discuss with Ronny Flurry about Covid symptoms and the use of a monoclonal antibody infusion for those with mild to moderate Covid symptoms and at a high risk of hospitalization.     Pt is qualified for this infusion at the [facility] infusion center due to co-morbid conditions and/or a member of an at-risk group, however declines infusion at this time. She feels symptoms are mild and improving Symptoms tier reviewed as well as criteria for ending isolation.  Symptoms reviewed that would warrant ED/Hospital evaluation. Preventative practices reviewed. Patient verbalized understanding. Patient advised to call back if he/she opts to proceed with infusion. Callback number provided. Urgent care and/or ER precautions given for severe symptoms. Last date eligible for infusion: 12/5    Patient Active Problem List   Diagnosis Date Noted  . Migraine without aura, with intractable migraine, so stated, without mention of status migrainosus 05/11/2013  . Cervical radiculitis 05/11/2013  . Right arm numbness 05/11/2013  . Diarrhea 08/19/2012  . Abdominal pain, other specified site 08/19/2012  . Nausea alone 08/19/2012  . GERD (gastroesophageal reflux disease) 08/19/2012  SVI by criteria   Consuello Masse, NP [phone] [email or web address]

## 2020-10-04 ENCOUNTER — Ambulatory Visit (INDEPENDENT_AMBULATORY_CARE_PROVIDER_SITE_OTHER): Payer: BC Managed Care – PPO | Admitting: Internal Medicine

## 2020-10-04 ENCOUNTER — Encounter: Payer: Self-pay | Admitting: Internal Medicine

## 2020-10-04 ENCOUNTER — Other Ambulatory Visit: Payer: Self-pay

## 2020-10-04 DIAGNOSIS — R0600 Dyspnea, unspecified: Secondary | ICD-10-CM | POA: Diagnosis not present

## 2020-10-04 DIAGNOSIS — R058 Other specified cough: Secondary | ICD-10-CM

## 2020-10-04 DIAGNOSIS — R0609 Other forms of dyspnea: Secondary | ICD-10-CM | POA: Insufficient documentation

## 2020-10-04 MED ORDER — TRAMADOL HCL 50 MG PO TABS: 50.0000 mg | ORAL_TABLET | ORAL | 0 refills | Status: AC | PRN

## 2020-10-04 MED ORDER — METHYLPREDNISOLONE ACETATE 80 MG/ML IJ SUSP
120.0000 mg | Freq: Once | INTRAMUSCULAR | Status: AC
Start: 2020-10-04 — End: 2020-10-04
  Administered 2020-10-04: 120 mg via INTRAMUSCULAR

## 2020-10-04 MED ORDER — FAMOTIDINE 20 MG PO TABS: ORAL_TABLET | ORAL | 11 refills | Status: DC

## 2020-10-04 MED ORDER — PANTOPRAZOLE SODIUM 40 MG PO TBEC
40.0000 mg | DELAYED_RELEASE_TABLET | Freq: Every day | ORAL | 2 refills | Status: DC
Start: 2020-10-04 — End: 2022-11-25

## 2020-10-04 NOTE — Patient Instructions (Addendum)
Depomedrol 120 mg IM today  Stop the trelegy   Only use your albuterol as a rescue medication to be used if you can't catch your breath by resting or doing a relaxed purse lip breathing pattern.  - The less you use it, the better it will work when you need it. - Ok to use up to 2 puffs  every 4 hours if you must but call for immediate appointment if use goes up over your usual need - Don't leave home without it !!  (think of it like the spare tire for your car)   The key to effective treatment for your cough is eliminating the non-stop cycle of cough you're stuck in long enough to let your airway heal completely and then see if there is anything still making you cough once you stop the cough suppression, but this should take no more than 5 days to figure out  First take delsym two tsp every 12 hours and supplement if needed with  tramadol 50 mg up to 2 every 4 hours to suppress the urge to cough at all or even clear your throat. Swallowing water or using ice chips/non mint and menthol containing candies (such as lifesavers or sugarless jolly ranchers) are also effective.  You should rest your voice and avoid activities that you know make you cough.  Once you have eliminated the cough for 3 straight days try reducing the tramadol first,  then the delsym as tolerated.      Protonix (pantoprazole) Take 30-60 min before first meal of the day and Pepcid 20 mg one bedtime  Until no cough x 1 weeks off cough medications  GERD (REFLUX)  is an extremely common cause of respiratory symptoms, many times with no significant heartburn at all.    It can be treated with medication, but also with lifestyle changes including avoidance of late meals, excessive alcohol, smoking cessation, and avoid fatty foods, chocolate, peppermint, colas, red wine, and acidic juices such as orange juice.  NO MINT OR MENTHOL PRODUCTS SO NO COUGH DROPS   USE HARD CANDY INSTEAD (jolley ranchers or Stover's or Lifesavers (all  available in sugarless versions) NO OIL BASED VITAMINS - use powdered substitutes.  Please remember to go to the lab department @ [facility] for your tests - we will call you with the results when they are available.       If not 100% better in 2 weeks please call

## 2020-10-04 NOTE — Assessment & Plan Note (Signed)
Onset jan 2022 p covid  - no better on trelgy 10/04/2020 so d/c'd - cyclical cough rx 10/04/2020 >>>   Of the three most common causes of  Sub-acute / recurrent or chronic cough, only one (GERD)  can actually contribute to/ trigger  the other two (asthma and post nasal drip syndrome)  and perpetuate the cylce of cough.  While not intuitively obvious, many patients with chronic low grade reflux do not cough until there is a primary insult that disturbs the protective epithelial barrier and exposes sensitive nerve endings.   This is typically viral but can due to PNDS and  either may apply here.   The point is that once this occurs, it is difficult to eliminate the cycle  using anything but a maximally effective acid suppression regimen at least in the short run, accompanied by an appropriate diet to address non acid GERD and control / eliminate the cough itself for at least 3 days with tramadol plus >>> also   added  depomedrol 120 mg IM in case of component of Th-2 driven upper or lower airways inflammation (if cough responds short term only to relapse before return while will on full rx for uacs (as above), then  that would point to allergic rhinitis/ asthma or eos bronchitis as alternative dx)   >> f/u in 2 weeks if not all better   Each maintenance medication was reviewed in detail including emphasizing most importantly the difference between maintenance and prns and under what circumstances the prns are to be triggered using an action plan format where appropriate.  Total time for H and P, chart review, counseling, reviewing hfa device(s) , directly observing portions of ambulatory 02 saturation study/ and generating customized AVS unique to this office visit / same day charting =  55 min

## 2020-10-04 NOTE — Progress Notes (Signed)
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 Consult    Shortness of breath at rest and with activity since December 2021 (had covid in December) intermittent chest burning on upper right side when feels short of breath  Dyspnea:  Ok at rest if not speaking/ also noted  voice is squeaky  Cough: dry assoc with sob and worse with strong smells  Sleep: bed is flat, uses 2 pillows / wakes up several times night due more sob /chest tightness  SABA use: 2-3 per day maybe helps for an hour last used 12 h pta  No overt hb but gen upper anterior "chest burning" with coughing fits s gag/vomit  No obvious day to day or daytime variability or assoc excess/ purulent sputum or mucus plugs or hemoptysis or  subjective wheeze or overt sinus  symptoms.    Also denies any obvious fluctuation of symptoms with weather or environmental changes or other aggravating or alleviating factors except as outlined above   No unusual exposure hx or h/o childhood pna/ asthma or knowledge of premature birth.  Current Allergies, Complete Past Medical History, Past Surgical History, Family History, and Social History were reviewed in Sarah Collins record.  ROS  The following are not active complaints unless bolded Hoarseness, sore throat, dysphagia, dental problems, itching,  sneezing,  nasal congestion or discharge of excess mucus or purulent secretions, ear ache,   fever, chills, sweats, unintended wt loss or wt gain, classically pleuritic or exertional cp,  orthopnea pnd or arm/hand swelling  or leg swelling, presyncope, palpitations, abdominal pain, anorexia, nausea, vomiting, diarrhea  or change in bowel habits or change in bladder habits, change in stools or change in urine, dysuria, hematuria,  rash, arthralgias, visual complaints, headache, numbness, weakness or ataxia or problems with walking or coordination,  change in mood or  memory.           Past Medical History:  Diagnosis Date  . Diverticulitis   . IBS (irritable bowel syndrome)   . Migraine headache   . Ovarian cyst    left ovary    Outpatient Medications Prior to Visit  Medication Sig Dispense Refill  . albuterol (VENTOLIN HFA) 108 (90 Base) MCG/ACT inhaler Inhale 2 puffs into the lungs every 4 (four) hours as needed.    . cetirizine (ZYRTEC) 10 MG tablet Take 1 tablet (10 mg total) by mouth daily. 30 tablet 0  . TRELEGY ELLIPTA 100-62.5-25 MCG/INH AEPB Inhale 1 puff into the lungs daily.    . fluticasone (FLONASE) 50 MCG/ACT nasal spray Place 2 sprays into both nostrils daily. (Patient not taking: Reported on 10/04/2020) 16 g 0  . meloxicam (MOBIC) 15 MG tablet Take 1 tablet (15 mg total)  by mouth daily. (Patient not taking: Reported on 10/04/2020) 30 tablet 0  . ondansetron (ZOFRAN) 4 MG tablet Take 1 tablet (4 mg total) by mouth every 6 (six) hours. 12 tablet 0   No facility-administered medications prior to visit.     Objective:     BP 108/66 (BP Location: Left Arm, Cuff Size: Normal)   Pulse 90   Temp 98.5 F (36.9 C) (Temporal)   Ht 5\' 5"  (1.651 m)   Wt 173 lb 6.4 oz (78.7 kg)   SpO2 100% Comment: Room air  BMI 28.86 kg/m   SpO2: 100 % (Room air)   amb wf and with occ vigorous throat clearing    HEENT : pt wearing mask not removed for exam due to covid -19 concerns.     NECK :  without JVD/Nodes/TM/ nl carotid upstrokes bilaterally   LUNGS: no acc muscle use,  Nl contour chest which is clear to A and P bilaterally without cough on insp or exp maneuvers   CV:  RRR  no s3 or murmur or increase in P2, and no edema   ABD:  soft and nontender with nl inspiratory excursion in the supine position. No bruits or organomegaly appreciated, bowel sounds nl  MS:  Nl gait/ ext warm without deformities, calf tenderness, cyanosis or clubbing No obvious joint restrictions   SKIN: warm and dry without lesions    NEURO:  alert, approp, nl sensorium with  no motor or cerebellar deficits apparent.    cxr 09/14/20 reported nl by pt per Sarah Collins 09/16/20       Assessment   Upper airway cough syndrome Onset jan 2022 p covid  - no better on trelgy 10/04/2020 so d/c'd - cyclical cough rx 10/04/2020 >>>   Of the three most common causes of  Sub-acute / recurrent or chronic cough, only one (GERD)  can actually contribute to/ trigger  the other two (asthma and post nasal drip syndrome)  and perpetuate the cylce of cough.  While not intuitively obvious, many patients with chronic low grade reflux do not cough until there is a primary insult that disturbs the protective epithelial barrier and exposes sensitive nerve endings.   This is typically viral but can due to PNDS and  either may apply here.   The point is that once this occurs, it is difficult to eliminate the cycle  using anything but a maximally effective acid suppression regimen at least in the short run, accompanied by an appropriate diet to address non acid GERD and control / eliminate the cough itself for at least 3 days with tramadol plus >>> also   added  depomedrol 120 mg IM in case of component of Th-2 driven upper or lower airways inflammation (if cough responds short term only to relapse before return while will on full rx for uacs (as above), then  that would point to allergic rhinitis/ asthma or eos bronchitis as  alternative dx)   >> f/u in 2 weeks if not all better      DOE (dyspnea on exertion) Onset Jan 2022 p covid in Dec 2021  -  10/04/2020   Walked RA  Fast pace walk for total of appox 600 feet, no rest periods, patient complained of feeling short of breath at 450 feet but sats 99% at end of walk  Symptoms are markedly disproportionate to objective findings and not clear to what extent this is actually a pulmonary  problem but pt does appear to have difficult to  sort out respiratory symptoms of unknown origin for which  DDX  = almost all start with A and  include Adherence, Ace Inhibitors, Acid Reflux, Active Sinus Disease, Alpha 1 Antitripsin deficiency, Anxiety masquerading as Airways dz,  ABPA,  Allergy(esp in young), Aspiration (esp in elderly), Adverse effects of meds,  Active smoking or Vaping, A bunch of PE's/clot burden (a few small clots can't cause this syndrome unless there is already severe underlying pulm or vascular dz with poor reserve),  Anemia or thyroid disorder, plus two Bs  = Bronchiectasis and Beta blocker use..and one C= CHF     Pt did not go to lab as recommended to work thru the above ddx.   Each maintenance medication was reviewed in detail including emphasizing most importantly the difference between maintenance and prns and under what circumstances the prns are to be triggered using an action plan format where appropriate.  Total time for H and P, chart review, counseling, reviewing hfa device(s) , directly observing portions of ambulatory 02 saturation study/ and generating customized AVS unique to this office visit / same day charting =  55 min           Sandrea Hughs, MD 10/04/2020

## 2020-10-04 NOTE — Assessment & Plan Note (Addendum)
Onset Jan 2022 p covid in Dec 2021  -  10/04/2020   Walked RA  Fast pace walk for total of appox 600 feet, no rest periods, patient complained of feeling short of breath at 450 feet but sats 99% at end of walk  Symptoms are markedly disproportionate to objective findings and not clear to what extent this is actually a pulmonary  problem but pt does appear to have difficult to sort out respiratory symptoms of unknown origin for which  DDX  = almost all start with A and  include Adherence, Ace Inhibitors, Acid Reflux, Active Sinus Disease, Alpha 1 Antitripsin deficiency, Anxiety masquerading as Airways dz,  ABPA,  Allergy(esp in young), Aspiration (esp in elderly), Adverse effects of meds,  Active smoking or Vaping, A bunch of PE's/clot burden (a few small clots can't cause this syndrome unless there is already severe underlying pulm or vascular dz with poor reserve),  Anemia or thyroid disorder, plus two Bs  = Bronchiectasis and Beta blocker use..and one C= CHF     Pt did not go to lab as recommended to work thru the above ddx.

## 2020-10-07 ENCOUNTER — Other Ambulatory Visit (HOSPITAL_COMMUNITY)
Admission: RE | Admit: 2020-10-07 | Discharge: 2020-10-07 | Disposition: A | Discharge status: Home / Self Care | Payer: BC Managed Care – PPO | Source: Ambulatory Visit | Attending: Internal Medicine | Admitting: Internal Medicine

## 2020-10-07 ENCOUNTER — Encounter: Payer: Self-pay | Admitting: *Deleted

## 2020-10-07 ENCOUNTER — Other Ambulatory Visit: Payer: Self-pay

## 2020-10-07 DIAGNOSIS — R0609 Other forms of dyspnea: Secondary | ICD-10-CM | POA: Diagnosis not present

## 2020-10-07 DIAGNOSIS — R0600 Dyspnea, unspecified: Secondary | ICD-10-CM | POA: Diagnosis present

## 2020-10-07 LAB — BASIC METABOLIC PANEL
Anion gap: 10 (ref 5–15)
BUN: 23 mg/dL — ABNORMAL HIGH (ref 6–20)
CO2: 22 mmol/L (ref 22–32)
Calcium: 9.4 mg/dL (ref 8.9–10.3)
Chloride: 105 mmol/L (ref 98–111)
Creatinine, Ser: 0.61 mg/dL (ref 0.44–1.00)
GFR, Estimated: >60 mL/min (ref >60)
Glucose, Bld: 93 mg/dL (ref 70–99)
Potassium: 3.8 mmol/L (ref 3.5–5.1)
Sodium: 137 mmol/L (ref 135–145)

## 2020-10-07 LAB — CBC WITH DIFFERENTIAL/PLATELET
Abs Immature Granulocytes: 0.05 10*3/uL (ref 0.00–0.07)
Basophils Absolute: 0.1 10*3/uL (ref 0.0–0.1)
Basophils Relative: 1 %
Eosinophils Absolute: 0.1 10*3/uL (ref 0.0–0.5)
Eosinophils Relative: 1 %
HCT: 41.7 % (ref 36.0–46.0)
Hemoglobin: 14.3 g/dL (ref 12.0–15.0)
Immature Granulocytes: 1 %
Lymphocytes Relative: 22 %
Lymphs Abs: 2.3 10*3/uL (ref 0.7–4.0)
MCH: 32.2 pg (ref 26.0–34.0)
MCHC: 34.3 g/dL (ref 30.0–36.0)
MCV: 93.9 fL (ref 80.0–100.0)
Monocytes Absolute: 0.7 10*3/uL (ref 0.1–1.0)
Monocytes Relative: 6 %
Neutro Abs: 7.3 10*3/uL (ref 1.7–7.7)
Neutrophils Relative %: 69 %
Platelets: 346 10*3/uL (ref 150–400)
RBC: 4.44 MIL/uL (ref 3.87–5.11)
RDW: 12.1 % (ref 11.5–15.5)
WBC: 10.4 10*3/uL (ref 4.0–10.5)
nRBC: 0 % (ref 0.0–0.2)

## 2020-10-07 LAB — BRAIN NATRIURETIC PEPTIDE: B Natriuretic Peptide: 25 pg/mL (ref 0.0–100.0)

## 2020-10-07 LAB — TSH: TSH: 2.662 u[IU]/mL (ref 0.350–4.500)

## 2020-10-07 LAB — SEDIMENTATION RATE: Sed Rate: 2 mm/hr (ref 0–22)

## 2020-10-07 LAB — D-DIMER, QUANTITATIVE: D-Dimer, Quant: <0.27 ug/mL-FEU (ref 0.00–0.50)

## 2020-10-07 NOTE — Progress Notes (Signed)
Letter mailed

## 2020-10-11 ENCOUNTER — Telehealth: Payer: Self-pay

## 2020-10-11 NOTE — Telephone Encounter (Signed)
PA request was received from (pharmacy): [facility] Phone:[phone] Fax:  Medication name and strength: Pantoprazole Sodium 40 mg Dr Tablets Ordering Provider: Dr [name]  Was PA started with [facility]?: Yes If yes, please enter KEY: [id number] Medication tried and failed: None Covered Alternatives: N/A  PA sent to plan, time frame for approval / denial: response will be received within 24-72 hours.   Routing to [place] for follow-up

## 2020-10-13 LAB — IGE: IgE (Immunoglobulin E), Serum: 4 [IU]/mL — ABNORMAL LOW (ref 6–495)

## 2020-11-07 NOTE — Telephone Encounter (Signed)
Received a form from [facility] for PA on pantoprazole  Forms filled out and faxed to [phone] Will await approval/denial

## 2021-03-27 ENCOUNTER — Other Ambulatory Visit: Payer: Self-pay

## 2021-03-27 ENCOUNTER — Ambulatory Visit
Admission: EM | Admit: 2021-03-27 | Discharge: 2021-03-27 | Disposition: A | Discharge status: Home / Self Care | Payer: BC Managed Care – PPO | Attending: Physician Assistant | Admitting: Physician Assistant

## 2021-03-27 ENCOUNTER — Encounter: Payer: Self-pay | Admitting: Emergency Medicine

## 2021-03-27 ENCOUNTER — Ambulatory Visit: Payer: Self-pay

## 2021-03-27 DIAGNOSIS — Z1152 Encounter for screening for COVID-19: Secondary | ICD-10-CM

## 2021-03-27 DIAGNOSIS — B349 Viral infection, unspecified: Secondary | ICD-10-CM | POA: Diagnosis not present

## 2021-03-27 LAB — POCT INFLUENZA A/B
Influenza A, POC: NEGATIVE
Influenza B, POC: NEGATIVE

## 2021-03-27 NOTE — ED Provider Notes (Signed)
[facility]    CSN: [id number] Arrival date & time: 03/27/21  1705      History   Chief Complaint Chief Complaint  Patient presents with   Generalized Body Aches   Fever   APPT 1700    HPI Sarah Collins is a 35 y.o. female.   Pt complains of fever, cough and body aches   The history is provided by the patient. No language interpreter was used.  Fever Max temp prior to arrival:  99.9 Temp source:  Oral Onset quality:  Gradual Timing:  Constant Progression:  Worsening Worsened by:  Nothing Ineffective treatments:  None tried Associated symptoms: chills and cough    Past Medical History:  Diagnosis Date   Diverticulitis    IBS (irritable bowel syndrome)    Migraine headache    Ovarian cyst    left ovary    Patient Active Problem List   Diagnosis Date Noted   DOE (dyspnea on exertion) 10/04/2020   Upper airway cough syndrome 10/04/2020   Migraine without aura, with intractable migraine, so stated, without mention of status migrainosus 05/11/2013   Cervical radiculitis 05/11/2013   Right arm numbness 05/11/2013   Diarrhea 08/19/2012   Abdominal pain, other specified site 08/19/2012   Nausea alone 08/19/2012   GERD (gastroesophageal reflux disease) 08/19/2012    Past Surgical History:  Procedure Laterality Date   CHOLECYSTECTOMY     age 25   EXCISIONAL HEMORRHOIDECTOMY     TONSILLECTOMY     vaginal hematoma repair      OB History     Gravida  1   Para  1   Term  1   Preterm      AB      Living         SAB      IAB      Ectopic      Multiple      Live Births               Home Medications    Prior to Admission medications   Medication Sig Start Date End Date Taking? Authorizing Provider  albuterol (VENTOLIN HFA) 108 (90 Base) MCG/ACT inhaler Inhale 2 puffs into the lungs every 4 (four) hours as needed. 09/21/20   [provider]  cetirizine (ZYRTEC) 10 MG tablet Take 1 tablet (10 mg total) by mouth  daily. 12/13/19   Wurst, [place], PA-C  famotidine (PEPCID) 20 MG tablet One after supper 10/04/20   Nyoka Cowden, MD  pantoprazole (PROTONIX) 40 MG tablet Take 1 tablet (40 mg total) by mouth daily. Take 30-60 min before first meal of the day 10/04/20   Nyoka Cowden, MD  eletriptan (RELPAX) 40 MG tablet Take 1 tablet (40 mg total) by mouth as needed for migraine or headache. One tablet by mouth at onset of headache. May repeat in 2 hours if headache persists or recurs. Patient not taking: Reported on 10/13/2018 05/11/13 12/13/19  Micki Riley, MD    Family History Family History  Problem Relation Age of Onset   Healthy Mother    Healthy Father    Colon cancer Neg Hx     Social History Social History   Tobacco Use   Smoking status: Never   Smokeless tobacco: Never  Vaping Use   Vaping Use: Never used  Substance Use Topics   Alcohol use: No   Drug use: No     Allergies   Codeine, Morphine and  related, and Zofran [ondansetron]   Review of Systems Review of Systems  Constitutional:  Positive for chills and fever.  Respiratory:  Positive for cough.   All other systems reviewed and are negative.   Physical Exam Triage Vital Signs ED Triage Vitals  Enc Vitals Group     BP 03/27/21 1732 (!) 150/91     Pulse Rate 03/27/21 1732 92     Resp 03/27/21 1732 15     Temp 03/27/21 1732 98.9 F (37.2 C)     Temp Source 03/27/21 1732 Oral     SpO2 03/27/21 1732 98 %     Weight --      Height --      Head Circumference --      Peak Flow --      Pain Score 03/27/21 1731 7     Pain Loc --      Pain Edu? --      Excl. in GC? --    No data found.  Updated Vital Signs BP (!) 150/91 (BP Location: Right Arm)   Pulse 92   Temp 98.9 F (37.2 C) (Oral)   Resp 15   LMP 03/25/2021 (Approximate)   SpO2 98%   Visual Acuity Right Eye Distance:   Left Eye Distance:   Bilateral Distance:    Right Eye Near:   Left Eye Near:    Bilateral Near:     Physical Exam Vitals  and nursing note reviewed.  Constitutional:      Appearance: She is well-developed.  HENT:     Head: Normocephalic.  Pulmonary:     Effort: Pulmonary effort is normal.  Abdominal:     General: There is no distension.  Musculoskeletal:        General: Normal range of motion.     Cervical back: Normal range of motion.  Neurological:     Mental Status: She is alert and oriented to person, place, and time.     UC Treatments / Results  Labs (all labs ordered are listed, but only abnormal results are displayed) Labs Reviewed  NOVEL CORONAVIRUS, NAA  POCT INFLUENZA A/B    EKG   Radiology No results found.  Procedures Procedures (including critical care time)  Medications Ordered in UC Medications - No data to display  Initial Impression / Assessment and Plan / UC Course  I have reviewed the triage vital signs and the nursing notes.  Pertinent labs & imaging results that were available during my care of the patient were reviewed by me and considered in my medical decision making (see chart for details).     MDM:  Pt's husband has a positive covid test.  Final Clinical Impressions(s) / UC Diagnoses   Final diagnoses:  Encounter for screening for COVID-19   Discharge Instructions   None    ED Prescriptions   None    PDMP not reviewed this encounter. An After Visit Summary was printed and given to the patient.    Elson Areas, [place] 03/27/21 1820

## 2021-03-27 NOTE — ED Triage Notes (Signed)
Patient c/o generalized body aches and fever yesterday.   Patient endorses a temperature of 101.2 F at home.   Patient endorses fatigue and SOB upon exertion.   Patient states her husband tested positive for COVID.   Patient has taken Tylenol with no relief of symptoms.

## 2021-03-28 LAB — SARS-COV-2, NAA 2 DAY TAT

## 2021-03-28 LAB — NOVEL CORONAVIRUS, NAA: SARS-CoV-2, NAA: NOT DETECTED

## 2021-05-06 ENCOUNTER — Emergency Department (HOSPITAL_COMMUNITY)
Admission: EM | Admit: 2021-05-06 | Discharge: 2021-05-06 | Disposition: A | Discharge status: Home / Self Care | Payer: BC Managed Care – PPO | Attending: Emergency Medicine | Admitting: Emergency Medicine

## 2021-05-06 ENCOUNTER — Encounter (HOSPITAL_COMMUNITY): Payer: Self-pay | Admitting: *Deleted

## 2021-05-06 ENCOUNTER — Emergency Department (HOSPITAL_COMMUNITY): Payer: BC Managed Care – PPO

## 2021-05-06 DIAGNOSIS — Y92410 Unspecified street and highway as the place of occurrence of the external cause: Secondary | ICD-10-CM | POA: Insufficient documentation

## 2021-05-06 DIAGNOSIS — N9489 Other specified conditions associated with female genital organs and menstrual cycle: Secondary | ICD-10-CM | POA: Diagnosis not present

## 2021-05-06 DIAGNOSIS — S3991XA Unspecified injury of abdomen, initial encounter: Secondary | ICD-10-CM | POA: Diagnosis present

## 2021-05-06 DIAGNOSIS — S301XXA Contusion of abdominal wall, initial encounter: Secondary | ICD-10-CM | POA: Insufficient documentation

## 2021-05-06 LAB — CBC
HCT: 42.3 % (ref 36.0–46.0)
Hemoglobin: 14.5 g/dL (ref 12.0–15.0)
MCH: 32.2 pg (ref 26.0–34.0)
MCHC: 34.3 g/dL (ref 30.0–36.0)
MCV: 93.8 fL (ref 80.0–100.0)
Platelets: 360 10*3/uL (ref 150–400)
RBC: 4.51 MIL/uL (ref 3.87–5.11)
RDW: 11.9 % (ref 11.5–15.5)
WBC: 8.2 10*3/uL (ref 4.0–10.5)
nRBC: 0 % (ref 0.0–0.2)

## 2021-05-06 LAB — COMPREHENSIVE METABOLIC PANEL
ALT: 15 U/L (ref 0–44)
AST: 13 U/L — ABNORMAL LOW (ref 15–41)
Albumin: 4.4 g/dL (ref 3.5–5.0)
Alkaline Phosphatase: 56 U/L (ref 38–126)
Anion gap: 8 (ref 5–15)
BUN: 15 mg/dL (ref 6–20)
CO2: 26 mmol/L (ref 22–32)
Calcium: 9.1 mg/dL (ref 8.9–10.3)
Chloride: 102 mmol/L (ref 98–111)
Creatinine, Ser: 0.63 mg/dL (ref 0.44–1.00)
GFR, Estimated: >60 mL/min (ref >60)
Glucose, Bld: 93 mg/dL (ref 70–99)
Potassium: 3.6 mmol/L (ref 3.5–5.1)
Sodium: 136 mmol/L (ref 135–145)
Total Bilirubin: 0.3 mg/dL (ref 0.3–1.2)
Total Protein: 7.5 g/dL (ref 6.5–8.1)

## 2021-05-06 LAB — URINALYSIS, ROUTINE W REFLEX MICROSCOPIC
Bilirubin Urine: NEGATIVE
Glucose, UA: NEGATIVE mg/dL
Hgb urine dipstick: NEGATIVE
Ketones, ur: NEGATIVE mg/dL
Nitrite: NEGATIVE
Protein, ur: NEGATIVE mg/dL
Specific Gravity, Urine: 1.015 (ref 1.005–1.030)
pH: 6.5 (ref 5.0–8.0)

## 2021-05-06 LAB — HCG, QUANTITATIVE, PREGNANCY: hCG, Beta Chain, Quant, S: <1 m[IU]/mL (ref <5)

## 2021-05-06 LAB — URINALYSIS, MICROSCOPIC (REFLEX): RBC / HPF: NONE SEEN RBC/hpf (ref 0–5)

## 2021-05-06 LAB — LIPASE, BLOOD: Lipase: 35 U/L (ref 11–51)

## 2021-05-06 MED ORDER — PROCHLORPERAZINE EDISYLATE 10 MG/2ML IJ SOLN
10.0000 mg | Freq: Once | INTRAMUSCULAR | Status: AC
  Administered 2021-05-06: 10 mg via INTRAVENOUS
  Filled 2021-05-06: qty 2

## 2021-05-06 MED ORDER — PROCHLORPERAZINE MALEATE 10 MG PO TABS: 10.0000 mg | ORAL_TABLET | Freq: Two times a day (BID) | ORAL | 0 refills | Status: DC | PRN

## 2021-05-06 MED ORDER — METOCLOPRAMIDE HCL 5 MG/ML IJ SOLN
10.0000 mg | Freq: Once | INTRAMUSCULAR | Status: AC
  Administered 2021-05-06: 10 mg via INTRAVENOUS
  Filled 2021-05-06: qty 2

## 2021-05-06 MED ORDER — IOHEXOL 300 MG/ML  SOLN
100.0000 mL | Freq: Once | INTRAMUSCULAR | Status: AC | PRN
  Administered 2021-05-06: 100 mL via INTRAVENOUS

## 2021-05-06 MED ORDER — SODIUM CHLORIDE 0.9 % IV BOLUS
500.0000 mL | Freq: Once | INTRAVENOUS | Status: AC
  Administered 2021-05-06: 500 mL via INTRAVENOUS

## 2021-05-06 MED ORDER — HYDROCODONE-ACETAMINOPHEN 5-325 MG PO TABS
1.0000 | ORAL_TABLET | ORAL | 0 refills | Status: DC | PRN
Start: 2021-05-06 — End: 2021-11-05

## 2021-05-06 MED ORDER — HYDROMORPHONE HCL 1 MG/ML IJ SOLN
0.5000 mg | Freq: Once | INTRAMUSCULAR | Status: AC
  Administered 2021-05-06: 0.5 mg via INTRAVENOUS
  Filled 2021-05-06: qty 1

## 2021-05-06 NOTE — ED Notes (Signed)
ED Provider at bedside. 
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
## 2021-05-06 NOTE — ED Notes (Signed)
Patient transported to CT 
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
## 2021-11-05 ENCOUNTER — Ambulatory Visit
Admission: RE | Admit: 2021-11-05 | Discharge: 2021-11-05 | Disposition: A | Discharge status: Home / Self Care | Payer: BC Managed Care – PPO | Source: Ambulatory Visit | Attending: Family Medicine | Admitting: Family Medicine

## 2021-11-05 VITALS — BP 121/75 | HR 65 | Temp 98.3°F | Resp 18

## 2021-11-05 DIAGNOSIS — N390 Urinary tract infection, site not specified: Secondary | ICD-10-CM

## 2021-11-05 DIAGNOSIS — R109 Unspecified abdominal pain: Secondary | ICD-10-CM | POA: Diagnosis present

## 2021-11-05 DIAGNOSIS — R319 Hematuria, unspecified: Secondary | ICD-10-CM | POA: Insufficient documentation

## 2021-11-05 LAB — POCT URINALYSIS DIP (MANUAL ENTRY)
Bilirubin, UA: NEGATIVE
Glucose, UA: NEGATIVE mg/dL
Ketones, POC UA: NEGATIVE mg/dL
Nitrite, UA: NEGATIVE
Protein Ur, POC: NEGATIVE mg/dL
Spec Grav, UA: >=1.03 — AB (ref 1.010–1.025)
Urobilinogen, UA: 0.2 E.U./dL
pH, UA: 6 (ref 5.0–8.0)

## 2021-11-05 MED ORDER — ONDANSETRON 4 MG PO TBDP: 4.0000 mg | ORAL_TABLET | Freq: Three times a day (TID) | ORAL | 0 refills | Status: DC | PRN

## 2021-11-05 MED ORDER — CEFTRIAXONE SODIUM 500 MG IJ SOLR
500.0000 mg | Freq: Once | INTRAMUSCULAR | Status: AC
  Administered 2021-11-05: 500 mg via INTRAMUSCULAR

## 2021-11-05 MED ORDER — TAMSULOSIN HCL 0.4 MG PO CAPS: 0.4000 mg | ORAL_CAPSULE | Freq: Every day | ORAL | 0 refills | Status: DC

## 2021-11-05 MED ORDER — HYDROCODONE-ACETAMINOPHEN 5-325 MG PO TABS: 1.0000 | ORAL_TABLET | Freq: Four times a day (QID) | ORAL | 0 refills | Status: DC | PRN

## 2021-11-05 MED ORDER — CEPHALEXIN 500 MG PO CAPS
500.0000 mg | ORAL_CAPSULE | Freq: Two times a day (BID) | ORAL | 0 refills | Status: DC
Start: 2021-11-05 — End: 2022-11-25
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
## 2021-11-05 NOTE — ED Provider Notes (Signed)
[facility]    CSN: [id number] Arrival date & time: 11/05/21  1642      History   Chief Complaint Chief Complaint  Patient presents with   Back Pain    [name], vomiting, [name]    HPI Sarah Collins is a 36 y.o. female.   Presenting today with 2-day history of severe sharp stabbing left flank pain that radiates down to her left groin.  She states that the pain is eliciting nausea, vomiting.  Now also having urinary hesitancy, dysuria and states when she has to urinate the pain in her back worsens.  She has a history of kidney stones and states this feels similar but worse than ever before.  Had a shot of Toradol yesterday which only minimally helped the pain.  Denies abdominal pain, diarrhea, constipation, fever, chills, body aches.   Past Medical History:  Diagnosis Date   Diverticulitis    IBS (irritable bowel syndrome)    Migraine headache    Ovarian cyst    left ovary    Patient Active Problem List   Diagnosis Date Noted   DOE (dyspnea on exertion) 10/04/2020   Upper airway cough syndrome 10/04/2020   Migraine without aura, with intractable migraine, so stated, without mention of status migrainosus 05/11/2013   Cervical radiculitis 05/11/2013   Right arm numbness 05/11/2013   Diarrhea 08/19/2012   Abdominal pain, other specified site 08/19/2012   Nausea alone 08/19/2012   GERD (gastroesophageal reflux disease) 08/19/2012    Past Surgical History:  Procedure Laterality Date   CHOLECYSTECTOMY     age 36   EXCISIONAL HEMORRHOIDECTOMY     TONSILLECTOMY     vaginal hematoma repair      OB History     Gravida  1   Para  1   Term  1   Preterm      AB      Living         SAB      IAB      Ectopic      Multiple      Live Births               Home Medications    Prior to Admission medications   Medication Sig Start Date  End Date Taking? Authorizing Provider  cephALEXin (KEFLEX) 500 MG capsule Take 1 capsule (500 mg total) by mouth 2 (two) times daily. 11/05/21  Yes Particia NearingLane, Kaavya Puskarich Elizabeth, PA-C  ondansetron (ZOFRAN-ODT) 4 MG disintegrating tablet Take 1 tablet (4 mg total) by mouth every 8 (eight) hours as needed for nausea or vomiting. 11/05/21  Yes Particia NearingLane, Enes Rokosz Elizabeth, PA-C  tamsulosin (FLOMAX) 0.4 MG CAPS capsule Take 1 capsule (0.4 mg total) by mouth daily. 11/05/21  Yes Particia NearingLane, Promyse Ardito Elizabeth, PA-C  albuterol (VENTOLIN HFA) 108 (90 Base) MCG/ACT inhaler Inhale 2 puffs into the lungs every 4 (four) hours as needed. 09/21/20   [provider]  cetirizine (ZYRTEC) 10 MG tablet Take 1 tablet (10 mg total) by mouth daily. 12/13/19   Wurst, [place], PA-C  famotidine (PEPCID) 20 MG tablet One after supper 10/04/20   Nyoka CowdenWert, Michael B, MD  HYDROcodone-acetaminophen (NORCO/VICODIN) 5-325 MG tablet Take 1 tablet by mouth every 6 (six) hours as needed. 11/05/21   Particia NearingLane, Magdala Brahmbhatt Elizabeth, PA-C  pantoprazole (PROTONIX) 40 MG tablet Take 1 tablet (40 mg total) by mouth daily.  Take 30-60 min before first meal of the day 10/04/20   Nyoka Cowden, MD  prochlorperazine (COMPAZINE) 10 MG tablet Take 1 tablet (10 mg total) by mouth 2 (two) times daily as needed for nausea or vomiting. 05/06/21   Burgess Amor, PA-C  eletriptan (RELPAX) 40 MG tablet Take 1 tablet (40 mg total) by mouth as needed for migraine or headache. One tablet by mouth at onset of headache. May repeat in 2 hours if headache persists or recurs. Patient not taking: Reported on 10/13/2018 05/11/13 12/13/19  Micki Riley, MD    Family History Family History  Problem Relation Age of Onset   Healthy Mother    Healthy Father    Colon cancer Neg Hx     Social History Social History   Tobacco Use   Smoking status: Never    Passive exposure: Never   Smokeless tobacco: Never  Vaping Use   Vaping Use: Never used  Substance Use Topics   Alcohol use: Yes     Comment: Occas   Drug use: No     Allergies   Codeine, Morphine and related, and Zofran [ondansetron]   Review of Systems Review of Systems Per HPI  Physical Exam Triage Vital Signs ED Triage Vitals  Enc Vitals Group     BP 11/05/21 1740 121/75     Pulse Rate 11/05/21 1740 65     Resp 11/05/21 1740 18     Temp 11/05/21 1740 98.3 F (36.8 C)     Temp Source 11/05/21 1740 Oral     SpO2 11/05/21 1740 97 %     Weight --      Height --      Head Circumference --      Peak Flow --      Pain Score 11/05/21 1651 8     Pain Loc --      Pain Edu? --      Excl. in GC? --    No data found.  Updated Vital Signs BP 121/75 (BP Location: Right Arm)   Pulse 65   Temp 98.3 F (36.8 C) (Oral)   Resp 18   LMP 10/04/2021 (Exact Date)   SpO2 97%   Visual Acuity Right Eye Distance:   Left Eye Distance:   Bilateral Distance:    Right Eye Near:   Left Eye Near:    Bilateral Near:     Physical Exam Vitals and nursing note reviewed.  Constitutional:      Appearance: Normal appearance. She is not ill-appearing.  HENT:     Head: Atraumatic.     Mouth/Throat:     Mouth: Mucous membranes are moist.     Pharynx: Oropharynx is clear.  Eyes:     Extraocular Movements: Extraocular movements intact.     Conjunctiva/sclera: Conjunctivae normal.  Cardiovascular:     Rate and Rhythm: Normal rate and regular rhythm.     Heart sounds: Normal heart sounds.  Pulmonary:     Effort: Pulmonary effort is normal.     Breath sounds: Normal breath sounds.  Abdominal:     General: Bowel sounds are normal. There is no distension.     Palpations: Abdomen is soft.     Tenderness: There is left CVA tenderness. There is no right CVA tenderness or guarding.  Musculoskeletal:        General: Normal range of motion.     Cervical back: Normal range of motion and neck supple.  Skin:  General: Skin is warm and dry.  Neurological:     Mental Status: She is alert and oriented to person, place, and  time.  Psychiatric:        Mood and Affect: Mood normal.        Thought Content: Thought content normal.        Judgment: Judgment normal.     UC Treatments / Results  Labs (all labs ordered are listed, but only abnormal results are displayed) Labs Reviewed  POCT URINALYSIS DIP (MANUAL ENTRY) - Abnormal; Notable for the following components:      Result Value   Clarity, UA cloudy (*)    Spec Grav, UA >=1.030 (*)    Blood, UA trace-intact (*)    Leukocytes, UA Large (3+) (*)    All other components within normal limits  URINE CULTURE    EKG   Radiology No results found.  Procedures Procedures (including critical care time)  Medications Ordered in UC Medications  cefTRIAXone (ROCEPHIN) injection 500 mg (500 mg Intramuscular Given 11/05/21 1748)    Initial Impression / Assessment and Plan / UC Course  I have reviewed the triage vital signs and the nursing notes.  Pertinent labs & imaging results that were available during my care of the patient were reviewed by me and considered in my medical decision making (see chart for details).     Consistent with nephrolithiasis, likely causing secondary urinary tract infection.  Treat with IM Rocephin, Keflex, Flomax, small supply of Norco for severe pain.  Zofran sent for as needed nausea and vomiting as well.  Push fluids, ED for worsening symptoms.  Urine culture pending.  Final Clinical Impressions(s) / UC Diagnoses   Final diagnoses:  Acute lower UTI  Left flank pain  Hematuria, unspecified type   Discharge Instructions   None    ED Prescriptions     Medication Sig Dispense Auth. Provider   HYDROcodone-acetaminophen (NORCO/VICODIN) 5-325 MG tablet Take 1 tablet by mouth every 6 (six) hours as needed. 10 tablet Particia Nearing, PA-C   ondansetron (ZOFRAN-ODT) 4 MG disintegrating tablet Take 1 tablet (4 mg total) by mouth every 8 (eight) hours as needed for nausea or vomiting. 20 tablet Particia Nearing,  [place]   cephALEXin (KEFLEX) 500 MG capsule Take 1 capsule (500 mg total) by mouth 2 (two) times daily. 10 capsule Particia Nearing, [place]   tamsulosin (FLOMAX) 0.4 MG CAPS capsule Take 1 capsule (0.4 mg total) by mouth daily. 30 capsule Particia Nearing, [place]      I have reviewed the PDMP during this encounter.   Particia Nearing, [place] 11/05/21 [phone]

## 2021-11-07 LAB — URINE CULTURE

## 2022-07-30 ENCOUNTER — Other Ambulatory Visit: Payer: Self-pay

## 2022-07-30 ENCOUNTER — Ambulatory Visit
Admission: RE | Admit: 2022-07-30 | Discharge: 2022-07-30 | Disposition: A | Discharge status: Home / Self Care | Payer: BC Managed Care – PPO | Source: Ambulatory Visit | Attending: Family Medicine | Admitting: Family Medicine

## 2022-07-30 VITALS — BP 137/71 | HR 85 | Temp 98.3°F | Resp 20

## 2022-07-30 DIAGNOSIS — R112 Nausea with vomiting, unspecified: Secondary | ICD-10-CM

## 2022-07-30 DIAGNOSIS — R1012 Left upper quadrant pain: Secondary | ICD-10-CM | POA: Diagnosis not present

## 2022-07-30 DIAGNOSIS — R197 Diarrhea, unspecified: Secondary | ICD-10-CM | POA: Diagnosis not present

## 2022-07-30 LAB — POCT URINALYSIS DIP (MANUAL ENTRY)
Bilirubin, UA: NEGATIVE
Blood, UA: NEGATIVE
Glucose, UA: NEGATIVE mg/dL
Leukocytes, UA: NEGATIVE
Nitrite, UA: NEGATIVE
Protein Ur, POC: NEGATIVE mg/dL
Spec Grav, UA: >=1.03 — AB (ref 1.010–1.025)
Urobilinogen, UA: 0.2 E.U./dL
pH, UA: 5.5 (ref 5.0–8.0)

## 2022-07-30 LAB — POCT URINE PREGNANCY: Preg Test, Ur: NEGATIVE

## 2022-07-30 MED ORDER — PROMETHAZINE HCL 25 MG PO TABS: 25.0000 mg | ORAL_TABLET | Freq: Three times a day (TID) | ORAL | 0 refills | Status: DC | PRN

## 2022-07-30 MED ORDER — METOCLOPRAMIDE HCL 5 MG/ML IJ SOLN
10.0000 mg | Freq: Once | INTRAMUSCULAR | Status: AC
  Administered 2022-07-30: 10 mg via INTRAMUSCULAR

## 2022-07-30 NOTE — ED Provider Notes (Signed)
[facility]    CSN: [id number] Arrival date & time: 07/30/22  1146      History   Chief Complaint Chief Complaint  Patient presents with   Abdominal Pain    Have had severe upper abdominal pain with nausea, vomitting and diarrhea since Sunday. Can't keep anything down - Entered by patient    HPI Sarah Collins is a 37 y.o. female.   Presenting today with 4-day history of upper abdominal pain, nausea, vomiting, diarrhea, fatigue.  Denies chest pain, shortness of breath, palpitations, urinary or vaginal symptoms, upper respiratory symptoms.  Symptoms started shortly after eating out at a restaurant.  So far not trying anything over-the-counter for symptoms.  Does have a history of IBS GERD and diverticulitis but states this feels very different.    Past Medical History:  Diagnosis Date   Diverticulitis    IBS (irritable bowel syndrome)    Migraine headache    Ovarian cyst    left ovary    Patient Active Problem List   Diagnosis Date Noted   DOE (dyspnea on exertion) 10/04/2020   Upper airway cough syndrome 10/04/2020   Migraine without aura, with intractable migraine, so stated, without mention of status migrainosus 05/11/2013   Cervical radiculitis 05/11/2013   Right arm numbness 05/11/2013   Diarrhea 08/19/2012   Abdominal pain, other specified site 08/19/2012   Nausea alone 08/19/2012   GERD (gastroesophageal reflux disease) 08/19/2012    Past Surgical History:  Procedure Laterality Date   CHOLECYSTECTOMY     age 19   EXCISIONAL HEMORRHOIDECTOMY     TONSILLECTOMY     vaginal hematoma repair      OB History     Gravida  1   Para  1   Term  1   Preterm      AB      Living         SAB      IAB      Ectopic      Multiple      Live Births               Home Medications    Prior to Admission medications   Medication Sig Start Date End Date Taking? Authorizing Provider  promethazine (PHENERGAN) 25 MG tablet Take 1  tablet (25 mg total) by mouth every 8 (eight) hours as needed for nausea or vomiting. May cause drowsiness 07/30/22  Yes Volney American, PA-C  albuterol (VENTOLIN HFA) 108 (90 Base) MCG/ACT inhaler Inhale 2 puffs into the lungs every 4 (four) hours as needed. 09/21/20   [provider]  cephALEXin (KEFLEX) 500 MG capsule Take 1 capsule (500 mg total) by mouth 2 (two) times daily. 11/05/21   Volney American, PA-C  cetirizine (ZYRTEC) 10 MG tablet Take 1 tablet (10 mg total) by mouth daily. Patient taking differently: Take 10 mg by mouth daily. As needed 12/13/19   Wurst, [place], PA-C  famotidine (PEPCID) 20 MG tablet One after supper 10/04/20   Tanda Rockers, MD  HYDROcodone-acetaminophen (NORCO/VICODIN) 5-325 MG tablet Take 1 tablet by mouth every 6 (six) hours as needed. 11/05/21   Volney American, PA-C  ondansetron (ZOFRAN-ODT) 4 MG disintegrating tablet Take 1 tablet (4 mg total) by mouth every 8 (eight) hours as needed for nausea or vomiting. 11/05/21   Volney American, PA-C  pantoprazole (PROTONIX) 40 MG tablet Take 1 tablet (40 mg total) by mouth daily. Take 30-60 min before first  meal of the day 10/04/20   Tanda Rockers, MD  prochlorperazine (COMPAZINE) 10 MG tablet Take 1 tablet (10 mg total) by mouth 2 (two) times daily as needed for nausea or vomiting. 05/06/21   Evalee Jefferson, PA-C  tamsulosin (FLOMAX) 0.4 MG CAPS capsule Take 1 capsule (0.4 mg total) by mouth daily. 11/05/21   Volney American, PA-C  eletriptan (RELPAX) 40 MG tablet Take 1 tablet (40 mg total) by mouth as needed for migraine or headache. One tablet by mouth at onset of headache. May repeat in 2 hours if headache persists or recurs. Patient not taking: Reported on 10/13/2018 05/11/13 12/13/19  Garvin Fila, MD    Family History Family History  Problem Relation Age of Onset   Healthy Mother    Healthy Father    Colon cancer Neg Hx     Social History Social History   Tobacco Use    Smoking status: Never    Passive exposure: Never   Smokeless tobacco: Never  Vaping Use   Vaping Use: Never used  Substance Use Topics   Alcohol use: Yes    Comment: Occas   Drug use: No     Allergies   Codeine, Morphine and related, and Zofran [ondansetron]   Review of Systems Review of Systems PER HPI  Physical Exam Triage Vital Signs ED Triage Vitals  Enc Vitals Group     BP 07/30/22 1217 137/71     Pulse Rate 07/30/22 1217 85     Resp 07/30/22 1217 20     Temp 07/30/22 1217 98.3 F (36.8 C)     Temp Source 07/30/22 1217 Oral     SpO2 07/30/22 1217 98 %     Weight --      Height --      Head Circumference --      Peak Flow --      Pain Score 07/30/22 1216 7     Pain Loc --      Pain Edu? --      Excl. in [place]? --    No data found.  Updated Vital Signs BP 137/71 (BP Location: Right Arm)   Pulse 85   Temp 98.3 F (36.8 C) (Oral)   Resp 20   LMP 06/26/2022 (Approximate) Comment: history of irregular periods  SpO2 98%   Visual Acuity Right Eye Distance:   Left Eye Distance:   Bilateral Distance:    Right Eye Near:   Left Eye Near:    Bilateral Near:     Physical Exam Vitals and nursing note reviewed.  Constitutional:      Appearance: Normal appearance. She is not ill-appearing.  HENT:     Head: Atraumatic.     Mouth/Throat:     Mouth: Mucous membranes are moist.  Eyes:     Extraocular Movements: Extraocular movements intact.     Conjunctiva/sclera: Conjunctivae normal.  Cardiovascular:     Rate and Rhythm: Normal rate and regular rhythm.     Heart sounds: Normal heart sounds.  Pulmonary:     Effort: Pulmonary effort is normal.     Breath sounds: Normal breath sounds.  Abdominal:     General: Bowel sounds are normal. There is no distension.     Palpations: Abdomen is soft.     Tenderness: There is no abdominal tenderness. There is no right CVA tenderness, left CVA tenderness or guarding.  Musculoskeletal:        General: Normal range of  motion.  Cervical back: Normal range of motion and neck supple.  Skin:    General: Skin is warm and dry.  Neurological:     Mental Status: She is alert and oriented to person, place, and time.  Psychiatric:        Mood and Affect: Mood normal.        Thought Content: Thought content normal.        Judgment: Judgment normal.    UC Treatments / Results  Labs (all labs ordered are listed, but only abnormal results are displayed) Labs Reviewed  POCT URINALYSIS DIP (MANUAL ENTRY) - Abnormal; Notable for the following components:      Result Value   Ketones, POC UA moderate (40) (*)    Spec Grav, UA >=1.030 (*)    All other components within normal limits  POCT URINE PREGNANCY    EKG   Radiology No results found.  Procedures Procedures (including critical care time)  Medications Ordered in UC Medications  metoCLOPramide (REGLAN) injection 10 mg (10 mg Intramuscular Given 07/30/22 1236)    Initial Impression / Assessment and Plan / UC Course  I have reviewed the triage vital signs and the nursing notes.  Pertinent labs & imaging results that were available during my care of the patient were reviewed by me and considered in my medical decision making (see chart for details).     Mild improvement with IM Reglan in clinic, vitals and exam overall reassuring with no red flag findings.  Suspect viral GI illness causing symptoms.  Will discharge on Phenergan and discussed fluids, brat diet, rest and return precautions.  Work note given.  Final Clinical Impressions(s) / UC Diagnoses   Final diagnoses:  Nausea vomiting and diarrhea  LUQ pain   Discharge Instructions   None    ED Prescriptions     Medication Sig Dispense Auth. Provider   promethazine (PHENERGAN) 25 MG tablet Take 1 tablet (25 mg total) by mouth every 8 (eight) hours as needed for nausea or vomiting. May cause drowsiness 20 tablet Volney American, [place]      PDMP not reviewed this encounter.    Volney American, [place] 07/30/22 1442

## 2022-07-30 NOTE — ED Triage Notes (Signed)
Pt reports epigastric pain,emesis,diarrhea, fatigue that radiates to back since Sunday. Pt reports symptoms started after dinner on Sunday. Denies any fevers.

## 2022-11-25 ENCOUNTER — Ambulatory Visit
Admission: RE | Admit: 2022-11-25 | Discharge: 2022-11-25 | Disposition: A | Discharge status: Home / Self Care | Payer: BC Managed Care – PPO | Source: Ambulatory Visit | Attending: Nurse Practitioner | Admitting: Nurse Practitioner

## 2022-11-25 ENCOUNTER — Other Ambulatory Visit: Payer: Self-pay

## 2022-11-25 VITALS — BP 148/97 | HR 103 | Temp 98.5°F | Resp 20

## 2022-11-25 DIAGNOSIS — R399 Unspecified symptoms and signs involving the genitourinary system: Secondary | ICD-10-CM | POA: Diagnosis not present

## 2022-11-25 LAB — POCT URINALYSIS DIP (MANUAL ENTRY)
Bilirubin, UA: NEGATIVE
Blood, UA: NEGATIVE
Glucose, UA: NEGATIVE mg/dL
Ketones, POC UA: NEGATIVE mg/dL
Nitrite, UA: NEGATIVE
Protein Ur, POC: NEGATIVE mg/dL
Spec Grav, UA: >=1.03 — AB (ref 1.010–1.025)
Urobilinogen, UA: 0.2 E.U./dL
pH, UA: 5.5 (ref 5.0–8.0)

## 2022-11-25 LAB — POCT URINE PREGNANCY: Preg Test, Ur: NEGATIVE

## 2022-11-25 MED ORDER — CEPHALEXIN 500 MG PO CAPS: 500.0000 mg | ORAL_CAPSULE | Freq: Two times a day (BID) | ORAL | 0 refills | Status: AC

## 2022-11-25 MED ORDER — ONDANSETRON 4 MG PO TBDP
4.0000 mg | ORAL_TABLET | Freq: Once | ORAL | Status: AC
  Administered 2022-11-25: 4 mg via ORAL

## 2022-11-25 MED ORDER — ONDANSETRON 4 MG PO TBDP: 4.0000 mg | ORAL_TABLET | Freq: Three times a day (TID) | ORAL | 0 refills | Status: DC | PRN

## 2022-11-25 NOTE — Discharge Instructions (Addendum)
Your symptoms are consistent with a urinary tract infection.  Take the Keflex as prescribed twice daily for 5 days to treat it.  You can continue Zofran at home every 8 hours as needed for nausea/vomiting.  Push hydration plenty of water.  Will call you if the urine culture shows we need to change the antibiotic toward the end of the week.  Seek care if your symptoms worsen and you develop high fever, nausea/vomiting and are unable to keep fluids down.

## 2022-11-25 NOTE — ED Notes (Addendum)
Repeat urinalysis: GLU Negative                               BIL   Negative                               KET Trace                               SG   >=1.030                               BLO Trace-intact                               pH    5.5                               PRO Negative                               URO 0.2 E.U./dL                               NIT   Negative                               LEU  Large  NP aware. Second urinalysis sample used for urine culture.

## 2022-11-25 NOTE — ED Triage Notes (Signed)
Pt reports straining with urination, urinary frequency, dysuria intermittent since Sunday. Pt reports pain has increased since yesterday.

## 2022-11-25 NOTE — ED Provider Notes (Signed)
[facility]    CSN: [id number] Arrival date & time: 11/25/22  1358      History   Chief Complaint Chief Complaint  Patient presents with   Dysuria    I'm having difficulty urinating and still feel like my bladder is full after I do use the bathroom. Generalized achy and not feeling good. Lots of pressure and pain near my bladder - Entered by patient    HPI Sarah Collins is a 37 y.o. female.   Patient presents today with 3-day history of straining with urination, pain with urination, urinary frequency and urgency, voiding small amounts, foul urinary odor, lower abdominal pain, suprapubic pain, cold sweats last night, nausea and vomiting.  She denies new urinary incontinence, hematuria, back pain, flank pain, known fevers, vaginal discharge.  Has not taken anything for symptoms so far.  Patient is confident she is not pregnant; husband has had vasectomy and last menstrual period is within date.  She reports history of UTI in the past; last UTI was more than 3 months ago.  No antibiotic use in the past 90 days.     Past Medical History:  Diagnosis Date   Diverticulitis    IBS (irritable bowel syndrome)    Migraine headache    Ovarian cyst    left ovary    Patient Active Problem List   Diagnosis Date Noted   DOE (dyspnea on exertion) 10/04/2020   Upper airway cough syndrome 10/04/2020   Migraine without aura, with intractable migraine, so stated, without mention of status migrainosus 05/11/2013   Cervical radiculitis 05/11/2013   Right arm numbness 05/11/2013   Diarrhea 08/19/2012   Abdominal pain, other specified site 08/19/2012   Nausea alone 08/19/2012   GERD (gastroesophageal reflux disease) 08/19/2012    Past Surgical History:  Procedure Laterality Date   CHOLECYSTECTOMY     age 72   EXCISIONAL HEMORRHOIDECTOMY     TONSILLECTOMY     vaginal hematoma repair      OB History     Gravida  1   Para  1   Term  1   Preterm      AB       Living         SAB      IAB      Ectopic      Multiple      Live Births               Home Medications    Prior to Admission medications   Medication Sig Start Date End Date Taking? Authorizing Provider  cephALEXin (KEFLEX) 500 MG capsule Take 1 capsule (500 mg total) by mouth 2 (two) times daily for 5 days. 11/25/22 11/30/22 Yes Valentino Nose, NP  loratadine (CLARITIN) 10 MG tablet Take 10 mg by mouth daily.   Yes [provider]  omeprazole (PRILOSEC) 10 MG capsule Take 10 mg by mouth daily.   Yes [provider]  albuterol (VENTOLIN HFA) 108 (90 Base) MCG/ACT inhaler Inhale 2 puffs into the lungs every 4 (four) hours as needed. 09/21/20   [provider]  cetirizine (ZYRTEC) 10 MG tablet Take 1 tablet (10 mg total) by mouth daily. Patient taking differently: Take 10 mg by mouth daily. As needed 12/13/19   Wurst, [place], PA-C  ondansetron (ZOFRAN-ODT) 4 MG disintegrating tablet Take 1 tablet (4 mg total) by mouth every 8 (eight) hours as needed for nausea or vomiting. 11/25/22   Valentino Nose,  NP  eletriptan (RELPAX) 40 MG tablet Take 1 tablet (40 mg total) by mouth as needed for migraine or headache. One tablet by mouth at onset of headache. May repeat in 2 hours if headache persists or recurs. Patient not taking: Reported on 10/13/2018 05/11/13 12/13/19  Micki Riley, MD    Family History Family History  Problem Relation Age of Onset   Healthy Mother    Healthy Father    Colon cancer Neg Hx     Social History Social History   Tobacco Use   Smoking status: Never    Passive exposure: Never   Smokeless tobacco: Never  Vaping Use   Vaping Use: Never used  Substance Use Topics   Alcohol use: Yes    Comment: Occas   Drug use: No     Allergies   Codeine, Morphine and codeine, and Zofran [ondansetron]   Review of Systems Review of Systems Per HPI  Physical Exam Triage Vital Signs ED Triage Vitals  Enc Vitals Group      BP 11/25/22 1410 (!) 148/97     Pulse Rate 11/25/22 1410 (!) 103     Resp 11/25/22 1410 20     Temp 11/25/22 1410 98.5 F (36.9 C)     Temp Source 11/25/22 1410 Oral     SpO2 11/25/22 1410 93 %     Weight --      Height --      Head Circumference --      Peak Flow --      Pain Score 11/25/22 1408 7     Pain Loc --      Pain Edu? --      Excl. in [place]? --    No data found.  Updated Vital Signs BP (!) 148/97 (BP Location: Right Arm)   Pulse (!) 103   Temp 98.5 F (36.9 C) (Oral)   Resp 20   LMP 11/05/2022 (Approximate)   SpO2 93%   Visual Acuity Right Eye Distance:   Left Eye Distance:   Bilateral Distance:    Right Eye Near:   Left Eye Near:    Bilateral Near:     Physical Exam Vitals and nursing note reviewed.  Constitutional:      General: She is not in acute distress.    Appearance: She is not toxic-appearing.  Cardiovascular:     Rate and Rhythm: Normal rate and regular rhythm.  Pulmonary:     Effort: Pulmonary effort is normal. No respiratory distress.  Abdominal:     General: Abdomen is flat. Bowel sounds are normal. There is no distension.     Palpations: Abdomen is soft. There is no mass.     Tenderness: There is abdominal tenderness in the right lower quadrant, suprapubic area and left lower quadrant. There is no right CVA tenderness, left CVA tenderness or guarding.  Skin:    General: Skin is warm and dry.     Coloration: Skin is not jaundiced or pale.     Findings: No erythema.  Neurological:     Mental Status: She is alert and oriented to person, place, and time.     Motor: No weakness.     Gait: Gait normal.  Psychiatric:        Behavior: Behavior is cooperative.      UC Treatments / Results  Labs (all labs ordered are listed, but only abnormal results are displayed) Labs Reviewed  POCT URINALYSIS DIP (MANUAL ENTRY) - Abnormal; Notable for the  following components:      Result Value   Spec Grav, UA >=1.030 (*)    Leukocytes, UA  Trace (*)    All other components within normal limits  URINE CULTURE  POCT URINE PREGNANCY    EKG   Radiology No results found.  Procedures Procedures (including critical care time)  Medications Ordered in UC Medications  ondansetron (ZOFRAN-ODT) disintegrating tablet 4 mg (4 mg Oral Given 11/25/22 1441)    Initial Impression / Assessment and Plan / UC Course  I have reviewed the triage vital signs and the nursing notes.  Pertinent labs & imaging results that were available during my care of the patient were reviewed by me and considered in my medical decision making (see chart for details).   Patient is well-appearing, afebrile, not tachypneic, oxygenating well on room air.  Patient is mildly hypertensive and slightly tachycardic today in triage.  1. UTI symptoms Urinalysis today shows blood with leukocytes Urine culture is pending In the meantime, will treat with Keflex twice daily for 5 days Zofran given in urgent care today with improvement in nausea/vomiting, continue Zofran ODT at home Push hydration with water Strict ER precautions discussed with patient  The patient was given the opportunity to ask questions.  All questions answered to their satisfaction.  The patient is in agreement to this plan.     Final Clinical Impressions(s) / UC Diagnoses   Final diagnoses:  UTI symptoms     Discharge Instructions      Your symptoms are consistent with a urinary tract infection.  Take the Keflex as prescribed twice daily for 5 days to treat it.  You can continue Zofran at home every 8 hours as needed for nausea/vomiting.  Push hydration plenty of water.  Will call you if the urine culture shows we need to change the antibiotic toward the end of the week.  Seek care if your symptoms worsen and you develop high fever, nausea/vomiting and are unable to keep fluids down.     ED Prescriptions     Medication Sig Dispense Auth. Provider   ondansetron (ZOFRAN-ODT) 4 MG  disintegrating tablet Take 1 tablet (4 mg total) by mouth every 8 (eight) hours as needed for nausea or vomiting. 20 tablet Cathlean Marseilles A, NP   cephALEXin (KEFLEX) 500 MG capsule Take 1 capsule (500 mg total) by mouth 2 (two) times daily for 5 days. 10 capsule Valentino Nose, NP      PDMP not reviewed this encounter.   Valentino Nose, NP 11/25/22 1530

## 2022-11-27 LAB — URINE CULTURE

## 2023-06-15 ENCOUNTER — Other Ambulatory Visit: Payer: Self-pay

## 2023-06-15 ENCOUNTER — Ambulatory Visit
Admission: RE | Admit: 2023-06-15 | Discharge: 2023-06-15 | Disposition: A | Discharge status: Home / Self Care | Payer: BC Managed Care – PPO | Source: Ambulatory Visit | Attending: Nurse Practitioner | Admitting: Nurse Practitioner

## 2023-06-15 ENCOUNTER — Encounter (HOSPITAL_COMMUNITY): Payer: Self-pay

## 2023-06-15 ENCOUNTER — Emergency Department (HOSPITAL_COMMUNITY)
Admission: EM | Admit: 2023-06-15 | Discharge: 2023-06-15 | Disposition: A | Discharge status: Home / Self Care | Payer: BC Managed Care – PPO | Attending: Emergency Medicine | Admitting: Emergency Medicine

## 2023-06-15 VITALS — BP 172/123 | HR 96 | Temp 98.3°F | Resp 16

## 2023-06-15 DIAGNOSIS — Z79899 Other long term (current) drug therapy: Secondary | ICD-10-CM | POA: Diagnosis not present

## 2023-06-15 DIAGNOSIS — R42 Dizziness and giddiness: Secondary | ICD-10-CM

## 2023-06-15 DIAGNOSIS — I10 Essential (primary) hypertension: Secondary | ICD-10-CM | POA: Diagnosis present

## 2023-06-15 DIAGNOSIS — I160 Hypertensive urgency: Secondary | ICD-10-CM | POA: Insufficient documentation

## 2023-06-15 DIAGNOSIS — R519 Headache, unspecified: Secondary | ICD-10-CM

## 2023-06-15 LAB — CBC WITH DIFFERENTIAL/PLATELET
Abs Immature Granulocytes: 0.02 10*3/uL (ref 0.00–0.07)
Basophils Absolute: 0 10*3/uL (ref 0.0–0.1)
Basophils Relative: 1 %
Eosinophils Absolute: 0 10*3/uL (ref 0.0–0.5)
Eosinophils Relative: 1 %
HCT: 42.3 % (ref 36.0–46.0)
Hemoglobin: 14.7 g/dL (ref 12.0–15.0)
Immature Granulocytes: 0 %
Lymphocytes Relative: 22 %
Lymphs Abs: 1.7 10*3/uL (ref 0.7–4.0)
MCH: 31.8 pg (ref 26.0–34.0)
MCHC: 34.8 g/dL (ref 30.0–36.0)
MCV: 91.6 fL (ref 80.0–100.0)
Monocytes Absolute: 0.5 10*3/uL (ref 0.1–1.0)
Monocytes Relative: 6 %
Neutro Abs: 5.5 10*3/uL (ref 1.7–7.7)
Neutrophils Relative %: 70 %
Platelets: 424 10*3/uL — ABNORMAL HIGH (ref 150–400)
RBC: 4.62 MIL/uL (ref 3.87–5.11)
RDW: 12.6 % (ref 11.5–15.5)
WBC: 7.8 10*3/uL (ref 4.0–10.5)
nRBC: 0 % (ref 0.0–0.2)

## 2023-06-15 LAB — BASIC METABOLIC PANEL
Anion gap: 10 (ref 5–15)
BUN: 19 mg/dL (ref 6–20)
CO2: 22 mmol/L (ref 22–32)
Calcium: 9.2 mg/dL (ref 8.9–10.3)
Chloride: 106 mmol/L (ref 98–111)
Creatinine, Ser: 0.77 mg/dL (ref 0.44–1.00)
GFR, Estimated: >60 mL/min (ref >60)
Glucose, Bld: 94 mg/dL (ref 70–99)
Potassium: 3.8 mmol/L (ref 3.5–5.1)
Sodium: 138 mmol/L (ref 135–145)

## 2023-06-15 LAB — HCG, SERUM, QUALITATIVE: Preg, Serum: NEGATIVE

## 2023-06-15 MED ORDER — AMLODIPINE BESYLATE 2.5 MG PO TABS: 2.5000 mg | ORAL_TABLET | Freq: Every day | ORAL | 1 refills | Status: AC

## 2023-06-15 MED ORDER — METOCLOPRAMIDE HCL 5 MG/ML IJ SOLN
10.0000 mg | Freq: Once | INTRAMUSCULAR | Status: AC
  Administered 2023-06-15: 10 mg via INTRAVENOUS
  Filled 2023-06-15: qty 2

## 2023-06-15 MED ORDER — KETOROLAC TROMETHAMINE 15 MG/ML IJ SOLN
15.0000 mg | Freq: Once | INTRAMUSCULAR | Status: AC
  Administered 2023-06-15: 15 mg via INTRAVENOUS
  Filled 2023-06-15: qty 1

## 2023-06-15 MED ORDER — DIPHENHYDRAMINE HCL 50 MG/ML IJ SOLN
25.0000 mg | Freq: Once | INTRAMUSCULAR | Status: AC
  Administered 2023-06-15: 25 mg via INTRAVENOUS
  Filled 2023-06-15: qty 1

## 2023-06-15 NOTE — Discharge Instructions (Signed)
 It is very to follow-up with a primary care provider to follow-up your blood pressure and management of hypertension.  We are prescribing a medicine called Norvasc  to help with high blood pressure.  There are also diet modifications you can make as well as increasing exercise and losing weight.  If you develop continued, recurrent, or worsening headache, fever, neck stiffness, vomiting, blurry or double vision, weakness or numbness in your arms or legs, trouble speaking, or any other new/concerning symptoms then return to the ER for evaluation.

## 2023-06-15 NOTE — ED Notes (Signed)
 Next room   Pricilla Loveless, MD 06/15/23 [phone]

## 2023-06-15 NOTE — ED Triage Notes (Signed)
 Pt reports she has been having elevated BP readings x 2 months on and off.Feels like her heart is pounding fast in her chest and has a bad headache  x 2 days   States she cannot get in with her PCP

## 2023-06-15 NOTE — ED Triage Notes (Signed)
 Pt arrived via POV from local Urgent Care. Pt reports he BP readings have been elevated for months. Pt endorses nausea and a "terrible migraine."

## 2023-06-15 NOTE — Discharge Instructions (Signed)
 Please go directly to the ER for further evaluation and management.

## 2023-06-15 NOTE — ED Provider Notes (Signed)
 [place] [facility] Provider Note   CSN: [id number] Arrival date & time: 06/15/23  1424     History  Chief Complaint  Patient presents with   Hypertension    Sarah Collins is a 38 y.o. female.  HPI 38 year old female presents with headache and hypertension.  She was sent over here from urgent care.  She states that at times she has had high blood pressure when she checks it at home.  Most recently this was Thanksgiving when her mom who is a nurse checked her blood pressure and it was around to 170 systolic.  It came down on her recheck, though she does not remember the number.  Otherwise does not often check it.  She has been having a waxing and waning headache since 3 days ago that was much worse today.  At work they checked her blood pressure and it was around 160/110.  Went to urgent care where it was 172/123.  She states the headache has progressively worsened and it feels like an intense pressure.  Over the last few hours she feels like her vision is mildly blurry.  No double vision, fever, chest pain, shortness of breath or focal weakness/numbness.  She has nausea but no vomiting.  Home Medications Prior to Admission medications   Medication Sig Start Date End Date Taking? Authorizing Provider  acetaminophen  (TYLENOL ) 325 MG tablet Take 650 mg by mouth every 6 (six) hours as needed for mild pain (pain score 1-3), fever or headache.   Yes [provider]  amLODipine  (NORVASC ) 2.5 MG tablet Take 1 tablet (2.5 mg total) by mouth daily. 06/15/23  Yes Freddi Hamilton, MD  lansoprazole (PREVACID) 30 MG capsule Take 30 mg by mouth at bedtime.   Yes [provider]  eletriptan  (RELPAX ) 40 MG tablet Take 1 tablet (40 mg total) by mouth as needed for migraine or headache. One tablet by mouth at onset of headache. May repeat in 2 hours if headache persists or recurs. Patient not taking: Reported on 10/13/2018 05/11/13 12/13/19  Rosemarie Eather RAMAN, MD      Allergies    Codeine, Morphine and codeine, and Zofran  [ondansetron ]    Review of Systems   Review of Systems  Constitutional:  Negative for fever.  Respiratory:  Negative for shortness of breath.   Cardiovascular:  Negative for chest pain.  Gastrointestinal:  Positive for nausea. Negative for vomiting.  Neurological:  Positive for headaches. Negative for weakness and numbness.    Physical Exam Updated Vital Signs BP (!) 145/99   Pulse 81   Temp 97.9 F (36.6 C) (Oral)   Resp 16   Ht 5' 5 (1.651 m)   Wt 79 kg   LMP 06/09/2023 (Approximate)   SpO2 98%   BMI 28.98 kg/m  Physical Exam Vitals and nursing note reviewed.  Constitutional:      General: She is not in acute distress.    Appearance: She is well-developed. She is not ill-appearing or diaphoretic.  HENT:     Head: Normocephalic and atraumatic.  Eyes:     Extraocular Movements: Extraocular movements intact.  Cardiovascular:     Rate and Rhythm: Normal rate and regular rhythm.     Heart sounds: Normal heart sounds.  Pulmonary:     Effort: Pulmonary effort is normal.  Abdominal:     General: There is no distension.  Skin:    General: Skin is warm and dry.  Neurological:  Mental Status: She is alert.     Comments: CN 3-12 grossly intact. 5/5 strength in all 4 extremities. Grossly normal sensation. Normal finger to nose.      ED Results / Procedures / Treatments   Labs (all labs ordered are listed, but only abnormal results are displayed) Labs Reviewed  CBC WITH DIFFERENTIAL/PLATELET - Abnormal; Notable for the following components:      Result Value   Platelets 424 (*)    All other components within normal limits  BASIC METABOLIC PANEL  HCG, SERUM, QUALITATIVE    EKG EKG Interpretation Date/Time:  Tuesday June 15 2023 14:52:48 EST Ventricular Rate:  87 PR Interval:  138 QRS Duration:  80 QT Interval:  386 QTC Calculation: 464 R Axis:   64  Text Interpretation: Normal sinus  rhythm no acute ST/T changes no significant change since earlier in the day or 2010 Confirmed by Freddi Hamilton [phone]) on 06/15/2023 3:11:02 PM  Radiology No results found.  Procedures Procedures    Medications Ordered in ED Medications  metoCLOPramide  (REGLAN ) injection 10 mg (10 mg Intravenous Given 06/15/23 1610)  diphenhydrAMINE  (BENADRYL ) injection 25 mg (25 mg Intravenous Given 06/15/23 1610)  ketorolac  (TORADOL ) 15 MG/ML injection 15 mg (15 mg Intravenous Given 06/15/23 1609)    ED Course/ Medical Decision Making/ A&P                                 Medical Decision Making Amount and/or Complexity of Data Reviewed External Data Reviewed: notes.    Details: Urgent Care notes Previous BPs in system Labs: ordered.    Details: Unremarkable including normal renal function ECG/medicine tests: ordered and independent interpretation performed.    Details: No ischemia  Risk Prescription drug management.   Patient has a benign neuroexam.  Was given headache medicine here with improvement in her headache.  She still has a residual headache but is not nearly anywhere near where it was before.  Blood pressure is also dramatically come down.  However it is still elevated and I discussed with patient and she would like to start blood pressure medicine as she is currently searching for a doctor.  She otherwise is feeling better and well enough for discharge.  I do not think a CT is needed given no thunderclap component, neurofindings, etc.  No chest symptoms.  Will discharge home with return precautions.        Final Clinical Impression(s) / ED Diagnoses Final diagnoses:  Hypertensive urgency  Bad headache    Rx / DC Orders ED Discharge Orders          Ordered    amLODipine  (NORVASC ) 2.5 MG tablet  Daily        06/15/23 1827              Freddi Hamilton, MD 06/15/23 R[place]

## 2023-06-15 NOTE — ED Provider Notes (Signed)
 [facility]    CSN: [id number] Arrival date & time: 06/15/23  1318      History   Chief Complaint Chief Complaint  Patient presents with   Hypertension    Blood pressure has been high all day. Migraine for 3 days - Entered by patient    HPI Sarah Collins is a 38 y.o. female.   Patient presents today for headache and elevated blood pressure for the past couple of days that has been persistent.  She reports intermittent blood pressures greater than 100 diastolically that typically get better after rest.  Reports for the past couple of days, blood pressure has remained elevated, readings at work today 165/119, 164/112.  She has history of migraines, but states this headache feels different.  She reports it feels like the top of her head is going to blow off.  Has taken Tylenol  and Advil  for the headache without improvement, this typically treats her migraines.  Patient denies chest pain or shortness of breath actively, however does feel nauseated and has a little bit of dizziness.  She also reports it feels like her heart is pounding a lot harder than normal.    Past Medical History:  Diagnosis Date   Diverticulitis    IBS (irritable bowel syndrome)    Migraine headache    Ovarian cyst    left ovary    Patient Active Problem List   Diagnosis Date Noted   DOE (dyspnea on exertion) 10/04/2020   Upper airway cough syndrome 10/04/2020   Intractable migraine without aura 05/11/2013   Cervical radiculitis 05/11/2013   Right arm numbness 05/11/2013   Diarrhea 08/19/2012   Abdominal pain, other specified site 08/19/2012   Nausea alone 08/19/2012   GERD (gastroesophageal reflux disease) 08/19/2012    Past Surgical History:  Procedure Laterality Date   CHOLECYSTECTOMY     age 73   EXCISIONAL HEMORRHOIDECTOMY     TONSILLECTOMY     vaginal hematoma repair      OB History     Gravida  1   Para  1   Term  1   Preterm      AB      Living         SAB       IAB      Ectopic      Multiple      Live Births               Home Medications    Prior to Admission medications   Medication Sig Start Date End Date Taking? Authorizing Provider  albuterol  (VENTOLIN  HFA) 108 (90 Base) MCG/ACT inhaler Inhale 2 puffs into the lungs every 4 (four) hours as needed. 09/21/20   [provider]  cetirizine  (ZYRTEC ) 10 MG tablet Take 1 tablet (10 mg total) by mouth daily. Patient taking differently: Take 10 mg by mouth daily. As needed 12/13/19   Wurst, Brittany, PA-C  loratadine (CLARITIN) 10 MG tablet Take 10 mg by mouth daily.    [provider]  omeprazole (PRILOSEC) 10 MG capsule Take 10 mg by mouth daily.    [provider]  ondansetron  (ZOFRAN -ODT) 4 MG disintegrating tablet Take 1 tablet (4 mg total) by mouth every 8 (eight) hours as needed for nausea or vomiting. 11/25/22   Chandra Harlene LABOR, NP  eletriptan  (RELPAX ) 40 MG tablet Take 1 tablet (40 mg total) by mouth as needed for migraine or headache. One tablet by mouth at onset of  headache. May repeat in 2 hours if headache persists or recurs. Patient not taking: Reported on 10/13/2018 05/11/13 12/13/19  Rosemarie Eather RAMAN, MD    Family History Family History  Problem Relation Age of Onset   Healthy Mother    Healthy Father    Colon cancer Neg Hx     Social History Social History   Tobacco Use   Smoking status: Never    Passive exposure: Never   Smokeless tobacco: Never  Vaping Use   Vaping status: Never Used  Substance Use Topics   Alcohol use: Yes    Comment: Occas   Drug use: No     Allergies   Codeine, Morphine and codeine, and Zofran  [ondansetron ]   Review of Systems Review of Systems Per HPI  Physical Exam Triage Vital Signs ED Triage Vitals  Encounter Vitals Group     BP 06/15/23 1343 (!) 172/123     Systolic BP Percentile --      Diastolic BP Percentile --      Pulse Rate 06/15/23 1343 96     Resp 06/15/23 1343 16     Temp  06/15/23 1343 98.3 F (36.8 C)     Temp Source 06/15/23 1343 Oral     SpO2 06/15/23 1343 96 %     Weight --      Height --      Head Circumference --      Peak Flow --      Pain Score 06/15/23 1346 6     Pain Loc --      Pain Education --      Exclude from Growth Chart --    No data found.  Updated Vital Signs BP (!) 172/123 (BP Location: Right Arm) Comment: 2nd attempt  Pulse 96   Temp 98.3 F (36.8 C) (Oral)   Resp 16   LMP 06/09/2023 (Approximate)   SpO2 96%   Visual Acuity Right Eye Distance:   Left Eye Distance:   Bilateral Distance:    Right Eye Near:   Left Eye Near:    Bilateral Near:     Physical Exam Vitals and nursing note reviewed.  Constitutional:      General: She is not in acute distress.    Appearance: Normal appearance. She is not toxic-appearing.  HENT:     Head: Normocephalic and atraumatic.     Mouth/Throat:     Mouth: Mucous membranes are moist.     Pharynx: Oropharynx is clear.  Eyes:     General: No scleral icterus.    Extraocular Movements: Extraocular movements intact.  Cardiovascular:     Rate and Rhythm: Normal rate.  Pulmonary:     Effort: Pulmonary effort is normal. No respiratory distress.  Skin:    General: Skin is warm and dry.     Coloration: Skin is not jaundiced or pale.     Findings: No erythema.  Neurological:     Mental Status: She is alert and oriented to person, place, and time.  Psychiatric:        Behavior: Behavior is cooperative.      UC Treatments / Results  Labs (all labs ordered are listed, but only abnormal results are displayed) Labs Reviewed - No data to display  EKG   Radiology No results found.  Procedures Procedures (including critical care time)  Medications Ordered in UC Medications - No data to display  Initial Impression / Assessment and Plan / UC Course  I have reviewed the  triage vital signs and the nursing notes.  Pertinent labs & imaging results that were available during my  care of the patient were reviewed by me and considered in my medical decision making (see chart for details).   In triage today, patient is hypertensive, otherwise vital signs are stable.  1. Hypertensive urgency 2. Acute nonintractable headache, unspecified headache type 3. Dizziness EKG is reassuring today I recommended further evaluation and management in the emergency room for hypertensive urgency Patient is in agreement to plan Patient is safe to transport via private vehicle and declines EMS today She understands risks of driving to the ER herself  The patient was given the opportunity to ask questions.  All questions answered to their satisfaction.  The patient is in agreement to this plan.    Final Clinical Impressions(s) / UC Diagnoses   Final diagnoses:  Hypertensive urgency  Acute nonintractable headache, unspecified headache type  Dizziness     Discharge Instructions      Please go directly to the ER for further evaluation and management     ED Prescriptions   None    PDMP not reviewed this encounter.   Chandra Harlene LABOR, NP 06/15/23 [phone]

## 2023-06-15 NOTE — ED Notes (Signed)
 Patient is being discharged from the Urgent Care and sent to the Emergency Department via private vehicle . Per NP, patient is in need of higher level of care due to hypertensive urgency. Patient is aware and verbalizes understanding of plan of care.  Vitals:   06/15/23 1343  BP: (!) 172/123  Pulse: 96  Resp: 16  Temp: 98.3 F (36.8 C)  SpO2: 96%
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
## 2024-06-23 ENCOUNTER — Other Ambulatory Visit (INDEPENDENT_AMBULATORY_CARE_PROVIDER_SITE_OTHER): Payer: Self-pay | Admitting: Otolaryngology

## 2024-06-23 ENCOUNTER — Encounter (INDEPENDENT_AMBULATORY_CARE_PROVIDER_SITE_OTHER): Payer: Self-pay | Admitting: Otolaryngology

## 2024-06-23 MED ORDER — AMOXICILLIN-POT CLAVULANATE 875-125 MG PO TABS: 1.0000 | ORAL_TABLET | Freq: Two times a day (BID) | ORAL | 0 refills | Status: AC

## 2024-06-23 NOTE — Progress Notes (Signed)
 eRx augmentin. Risks discussed.
